# Patient Record
Sex: Female | Born: 1975 | Race: White | Hispanic: No | Marital: Married | Smoking: Former smoker
Health system: Southern US, Community
[De-identification: ages and names within clinical notes are randomized; demographics above are authoritative.]

## PROBLEM LIST (undated history)

## (undated) DIAGNOSIS — M1990 Unspecified osteoarthritis, unspecified site: Secondary | ICD-10-CM

## (undated) DIAGNOSIS — K5090 Crohn's disease, unspecified, without complications: Secondary | ICD-10-CM

## (undated) DIAGNOSIS — M069 Rheumatoid arthritis, unspecified: Secondary | ICD-10-CM

## (undated) DIAGNOSIS — F419 Anxiety disorder, unspecified: Secondary | ICD-10-CM

## (undated) DIAGNOSIS — I10 Essential (primary) hypertension: Secondary | ICD-10-CM

## (undated) DIAGNOSIS — L4050 Arthropathic psoriasis, unspecified: Secondary | ICD-10-CM

## (undated) HISTORY — DX: Anxiety disorder, unspecified: F41.9

## (undated) HISTORY — DX: Crohn's disease, unspecified, without complications: K50.90

## (undated) HISTORY — DX: Unspecified osteoarthritis, unspecified site: M19.90

## (undated) HISTORY — DX: Rheumatoid arthritis, unspecified: M06.9

## (undated) HISTORY — DX: Essential (primary) hypertension: I10

## (undated) HISTORY — PX: COLONOSCOPY: SHX174

## (undated) HISTORY — PX: LASER ABLATION: SHX1947

## (undated) HISTORY — DX: Arthropathic psoriasis, unspecified: L40.50

---

## 1997-12-20 ENCOUNTER — Encounter: Payer: Self-pay | Admitting: Emergency Medicine

## 1997-12-20 ENCOUNTER — Emergency Department (HOSPITAL_COMMUNITY): Admission: EM | Admit: 1997-12-20 | Discharge: 1997-12-20 | Payer: Self-pay | Admitting: Emergency Medicine

## 1998-11-12 ENCOUNTER — Other Ambulatory Visit: Admission: RE | Admit: 1998-11-12 | Discharge: 1998-11-12 | Payer: Self-pay | Admitting: Obstetrics and Gynecology

## 1999-12-03 ENCOUNTER — Other Ambulatory Visit: Admission: RE | Admit: 1999-12-03 | Discharge: 1999-12-03 | Payer: Self-pay | Admitting: Obstetrics and Gynecology

## 2001-02-09 ENCOUNTER — Other Ambulatory Visit: Admission: RE | Admit: 2001-02-09 | Discharge: 2001-02-09 | Payer: Self-pay | Admitting: Obstetrics and Gynecology

## 2002-02-08 ENCOUNTER — Other Ambulatory Visit: Admission: RE | Admit: 2002-02-08 | Discharge: 2002-02-08 | Payer: Self-pay | Admitting: Obstetrics and Gynecology

## 2003-03-28 ENCOUNTER — Other Ambulatory Visit: Admission: RE | Admit: 2003-03-28 | Discharge: 2003-03-28 | Payer: Self-pay | Admitting: Obstetrics and Gynecology

## 2004-05-09 ENCOUNTER — Encounter (INDEPENDENT_AMBULATORY_CARE_PROVIDER_SITE_OTHER): Payer: Self-pay | Admitting: *Deleted

## 2004-05-09 ENCOUNTER — Ambulatory Visit (HOSPITAL_COMMUNITY): Admission: RE | Admit: 2004-05-09 | Discharge: 2004-05-09 | Payer: Self-pay | Admitting: Dermatology

## 2004-05-14 ENCOUNTER — Encounter: Admission: RE | Admit: 2004-05-14 | Discharge: 2004-05-14 | Payer: Self-pay | Admitting: Neurosurgery

## 2004-09-27 ENCOUNTER — Other Ambulatory Visit: Admission: RE | Admit: 2004-09-27 | Discharge: 2004-09-27 | Payer: Self-pay | Admitting: Obstetrics and Gynecology

## 2008-02-01 ENCOUNTER — Inpatient Hospital Stay (HOSPITAL_COMMUNITY): Admission: AD | Admit: 2008-02-01 | Discharge: 2008-02-04 | Payer: Self-pay | Admitting: Obstetrics and Gynecology

## 2008-02-02 ENCOUNTER — Encounter (INDEPENDENT_AMBULATORY_CARE_PROVIDER_SITE_OTHER): Payer: Self-pay | Admitting: Obstetrics and Gynecology

## 2008-02-06 ENCOUNTER — Encounter: Admission: RE | Admit: 2008-02-06 | Discharge: 2008-02-29 | Payer: Self-pay | Admitting: Obstetrics and Gynecology

## 2009-12-05 ENCOUNTER — Ambulatory Visit (HOSPITAL_COMMUNITY): Admission: RE | Admit: 2009-12-05 | Discharge: 2009-12-05 | Payer: Self-pay | Admitting: Obstetrics and Gynecology

## 2010-01-18 ENCOUNTER — Inpatient Hospital Stay (HOSPITAL_COMMUNITY)
Admission: AD | Admit: 2010-01-18 | Discharge: 2010-01-18 | Payer: Self-pay | Source: Home / Self Care | Admitting: Obstetrics and Gynecology

## 2010-02-06 ENCOUNTER — Ambulatory Visit: Payer: Self-pay | Admitting: Obstetrics & Gynecology

## 2010-02-13 ENCOUNTER — Inpatient Hospital Stay (HOSPITAL_COMMUNITY)
Admission: AD | Admit: 2010-02-13 | Discharge: 2010-02-23 | Payer: Self-pay | Source: Home / Self Care | Attending: Obstetrics and Gynecology | Admitting: Obstetrics and Gynecology

## 2010-02-13 ENCOUNTER — Ambulatory Visit: Payer: Self-pay | Admitting: Obstetrics and Gynecology

## 2010-02-14 ENCOUNTER — Ambulatory Visit (HOSPITAL_COMMUNITY)
Admission: RE | Admit: 2010-02-14 | Discharge: 2010-02-14 | Payer: Self-pay | Source: Home / Self Care | Attending: Obstetrics and Gynecology | Admitting: Obstetrics and Gynecology

## 2010-02-20 ENCOUNTER — Ambulatory Visit: Payer: Self-pay | Admitting: Obstetrics and Gynecology

## 2010-02-20 ENCOUNTER — Encounter (HOSPITAL_COMMUNITY): Payer: Self-pay | Admitting: Obstetrics and Gynecology

## 2010-03-24 ENCOUNTER — Encounter: Payer: Self-pay | Admitting: Dermatology

## 2010-05-13 LAB — COMPREHENSIVE METABOLIC PANEL
ALT: 13 U/L (ref 0–35)
ALT: 13 U/L (ref 0–35)
ALT: 14 U/L (ref 0–35)
ALT: 16 U/L (ref 0–35)
AST: 17 U/L (ref 0–37)
AST: 20 U/L (ref 0–37)
AST: 21 U/L (ref 0–37)
AST: 27 U/L (ref 0–37)
Albumin: 2.2 g/dL — ABNORMAL LOW (ref 3.5–5.2)
Albumin: 2.2 g/dL — ABNORMAL LOW (ref 3.5–5.2)
Albumin: 2.3 g/dL — ABNORMAL LOW (ref 3.5–5.2)
Albumin: 2.4 g/dL — ABNORMAL LOW (ref 3.5–5.2)
Alkaline Phosphatase: 131 U/L — ABNORMAL HIGH (ref 39–117)
Alkaline Phosphatase: 134 U/L — ABNORMAL HIGH (ref 39–117)
Alkaline Phosphatase: 138 U/L — ABNORMAL HIGH (ref 39–117)
BUN: 3 mg/dL — ABNORMAL LOW (ref 6–23)
BUN: 6 mg/dL (ref 6–23)
CO2: 20 mEq/L (ref 19–32)
CO2: 25 mEq/L (ref 19–32)
Calcium: 7.1 mg/dL — ABNORMAL LOW (ref 8.4–10.5)
Calcium: 7.4 mg/dL — ABNORMAL LOW (ref 8.4–10.5)
Calcium: 8.4 mg/dL (ref 8.4–10.5)
Chloride: 101 mEq/L (ref 96–112)
Chloride: 106 mEq/L (ref 96–112)
Chloride: 107 mEq/L (ref 96–112)
Creatinine, Ser: 0.67 mg/dL (ref 0.4–1.2)
Creatinine, Ser: 0.73 mg/dL (ref 0.4–1.2)
Creatinine, Ser: 0.76 mg/dL (ref 0.4–1.2)
GFR calc Af Amer: >60 mL/min (ref >60)
GFR calc Af Amer: >60 mL/min (ref >60)
GFR calc Af Amer: >60 mL/min (ref >60)
GFR calc Af Amer: >60 mL/min (ref >60)
GFR calc non Af Amer: >60 mL/min (ref >60)
GFR calc non Af Amer: >60 mL/min (ref >60)
Glucose, Bld: 68 mg/dL — ABNORMAL LOW (ref 70–99)
Glucose, Bld: 70 mg/dL (ref 70–99)
Glucose, Bld: 70 mg/dL (ref 70–99)
Glucose, Bld: 72 mg/dL (ref 70–99)
Potassium: 4.1 mEq/L (ref 3.5–5.1)
Potassium: 4.4 mEq/L (ref 3.5–5.1)
Sodium: 137 mEq/L (ref 135–145)
Sodium: 137 mEq/L (ref 135–145)
Sodium: 137 mEq/L (ref 135–145)
Total Bilirubin: 0.3 mg/dL (ref 0.3–1.2)
Total Bilirubin: 0.6 mg/dL (ref 0.3–1.2)
Total Bilirubin: 0.6 mg/dL (ref 0.3–1.2)
Total Bilirubin: 0.8 mg/dL (ref 0.3–1.2)
Total Protein: 4.7 g/dL — ABNORMAL LOW (ref 6.0–8.3)
Total Protein: 4.9 g/dL — ABNORMAL LOW (ref 6.0–8.3)
Total Protein: 5.3 g/dL — ABNORMAL LOW (ref 6.0–8.3)
Total Protein: 5.5 g/dL — ABNORMAL LOW (ref 6.0–8.3)

## 2010-05-13 LAB — CBC
HCT: 25.8 % — ABNORMAL LOW (ref 36.0–46.0)
HCT: 25.8 % — ABNORMAL LOW (ref 36.0–46.0)
HCT: 25.9 % — ABNORMAL LOW (ref 36.0–46.0)
HCT: 26.1 % — ABNORMAL LOW (ref 36.0–46.0)
Hemoglobin: 8 g/dL — ABNORMAL LOW (ref 12.0–15.0)
Hemoglobin: 8.3 g/dL — ABNORMAL LOW (ref 12.0–15.0)
MCH: 24.9 pg — ABNORMAL LOW (ref 26.0–34.0)
MCH: 25.1 pg — ABNORMAL LOW (ref 26.0–34.0)
MCH: 25.5 pg — ABNORMAL LOW (ref 26.0–34.0)
MCH: 25.6 pg — ABNORMAL LOW (ref 26.0–34.0)
MCH: 25.9 pg — ABNORMAL LOW (ref 26.0–34.0)
MCHC: 30.9 g/dL (ref 30.0–36.0)
MCHC: 31 g/dL (ref 30.0–36.0)
MCHC: 31 g/dL (ref 30.0–36.0)
MCHC: 31.2 g/dL (ref 30.0–36.0)
MCHC: 31.4 g/dL (ref 30.0–36.0)
MCHC: 32 g/dL (ref 30.0–36.0)
MCV: 80.4 fL (ref 78.0–100.0)
MCV: 80.5 fL (ref 78.0–100.0)
MCV: 80.6 fL (ref 78.0–100.0)
MCV: 80.9 fL (ref 78.0–100.0)
MCV: 81.1 fL (ref 78.0–100.0)
MCV: 81.4 fL (ref 78.0–100.0)
Platelets: 138 10*3/uL — ABNORMAL LOW (ref 150–400)
Platelets: 143 10*3/uL — ABNORMAL LOW (ref 150–400)
Platelets: 144 10*3/uL — ABNORMAL LOW (ref 150–400)
Platelets: 148 10*3/uL — ABNORMAL LOW (ref 150–400)
Platelets: 154 10*3/uL (ref 150–400)
Platelets: 184 10*3/uL (ref 150–400)
RBC: 3.2 MIL/uL — ABNORMAL LOW (ref 3.87–5.11)
RBC: 3.21 MIL/uL — ABNORMAL LOW (ref 3.87–5.11)
RBC: 3.4 MIL/uL — ABNORMAL LOW (ref 3.87–5.11)
RBC: 3.54 MIL/uL — ABNORMAL LOW (ref 3.87–5.11)
RDW: 15 % (ref 11.5–15.5)
RDW: 15 % (ref 11.5–15.5)
RDW: 15 % (ref 11.5–15.5)
RDW: 15.4 % (ref 11.5–15.5)
WBC: 11.9 10*3/uL — ABNORMAL HIGH (ref 4.0–10.5)
WBC: 6.7 10*3/uL (ref 4.0–10.5)
WBC: 7.4 10*3/uL (ref 4.0–10.5)
WBC: 8.8 10*3/uL (ref 4.0–10.5)

## 2010-05-13 LAB — URIC ACID
Uric Acid, Serum: 5.3 mg/dL (ref 2.4–7.0)
Uric Acid, Serum: 6.1 mg/dL (ref 2.4–7.0)

## 2010-05-13 LAB — LACTATE DEHYDROGENASE
LDH: 153 U/L (ref 94–250)
LDH: 154 U/L (ref 94–250)
LDH: 189 U/L (ref 94–250)

## 2010-05-13 LAB — MAGNESIUM: Magnesium: 5.1 mg/dL — ABNORMAL HIGH (ref 1.5–2.5)

## 2010-05-13 LAB — TYPE AND SCREEN: ABO/RH(D): O POS

## 2010-05-14 LAB — COMPREHENSIVE METABOLIC PANEL
AST: 21 U/L (ref 0–37)
Albumin: 2.6 g/dL — ABNORMAL LOW (ref 3.5–5.2)
Alkaline Phosphatase: 118 U/L — ABNORMAL HIGH (ref 39–117)
Alkaline Phosphatase: 148 U/L — ABNORMAL HIGH (ref 39–117)
BUN: 3 mg/dL — ABNORMAL LOW (ref 6–23)
BUN: 6 mg/dL (ref 6–23)
Calcium: 8.6 mg/dL (ref 8.4–10.5)
Chloride: 106 mEq/L (ref 96–112)
GFR calc Af Amer: >60 mL/min (ref >60)
Glucose, Bld: 86 mg/dL (ref 70–99)
Potassium: 4.4 mEq/L (ref 3.5–5.1)
Total Bilirubin: 0.6 mg/dL (ref 0.3–1.2)
Total Protein: 6.3 g/dL (ref 6.0–8.3)

## 2010-05-14 LAB — CREATININE CLEARANCE, URINE, 24 HOUR
Creatinine Clearance: 133 mL/min — ABNORMAL HIGH (ref 75–115)
Creatinine, 24H Ur: 1359 mg/d (ref 700–1800)
Creatinine: 0.71 mg/dL (ref 0.4–1.2)
Urine Total Volume-CRCL: 2225 mL

## 2010-05-14 LAB — CBC
HCT: 29.9 % — ABNORMAL LOW (ref 36.0–46.0)
MCH: 28.9 pg (ref 26.0–34.0)
MCHC: 33.9 g/dL (ref 30.0–36.0)
MCV: 81.7 fL (ref 78.0–100.0)
MCV: 85.2 fL (ref 78.0–100.0)
Platelets: 168 10*3/uL (ref 150–400)
RBC: 3.66 MIL/uL — ABNORMAL LOW (ref 3.87–5.11)
RDW: 13.9 % (ref 11.5–15.5)
WBC: 9.1 10*3/uL (ref 4.0–10.5)

## 2010-05-14 LAB — URIC ACID: Uric Acid, Serum: 3.5 mg/dL (ref 2.4–7.0)

## 2010-05-14 LAB — PROTEIN, URINE, 24 HOUR: Collection Interval-UPROT: 24 hours

## 2010-05-14 LAB — STREP B DNA PROBE

## 2010-05-14 LAB — LACTATE DEHYDROGENASE: LDH: 153 U/L (ref 94–250)

## 2010-07-16 NOTE — H&P (Signed)
NAMEQUENNA, DOEPKE               ACCOUNT NO.:  [id number]   MEDICAL RECORD NO.:  [id number]          PATIENT TYPE:  INP   LOCATION:  9164                          FACILITY:  [facility]   PHYSICIAN:  Darlyn Chamber, M.D.   DATE OF BIRTH:  [birth year]   DATE OF ADMISSION:  02/01/2008  DATE OF DISCHARGE:                              HISTORY & PHYSICAL   The patient is a 35 year old gravida 1, para 0, female last menstrual  period of May 16, 2007, given her an estimated date of confinement of  December 21, 2007.  This gives her an estimated gestational age of 4 and  2/7th.   In relation to the present admission, her prenatal course has been  complicated by probable chronic hypertension.  She had been watched  closely and been monitored with nonstress testing and serial  ultrasounds.  Today in the office, her blood pressure was 180/100.  She  had 1+ protein.  Her NST was reactive.  She had 2+ edema and increasing  reflexes.  Because of this, she is now admitted for Cytotec ripening of  the cervix and induction due to worsening pregnancy-induced  hypertension.   The patient does have a history of genital herpes.  She is on Valtrex  suppression, last outbreak was in 2008.  She has no active lesions or  prodromal symptoms.   In terms of allergies, the patient has no known drug allergies.   Medications include prenatal vitamins.   For past medical history, family history, and social history, please see  prenatal records.   REVIEW OF SYSTEMS:  Noncontributory.   PHYSICAL EXAM:  VITAL SIGNS:  As noted in the office, blood pressure  180/100.  Other vital signs were stable.  LUNGS:  Clear.  CARDIOVASCULAR:  Regular rhythm and rate with 2/6 systolic ejection  murmur.  No clicks or gallops.  ABDOMEN:  Gravid uterus consistent with dates.  PELVIC:  The cervix fingertip 25% effaced, -3, no herpetic outbreaks  noted.  This is per Dr. Corinna Capra.  EXTREMITIES:  Edema 2+.  NEUROLOGIC:  Deep tendon  reflexes 3+.   IMPRESSION:  1. Interim pregnancy at 67 and 2/7th with worsening pregnancy-induced      hypertension.  2. History of genital herpes, no active outbreaks or prodromal      symptoms.   PLAN:  In view of increasing blood pressure which indicate severe PIH  with blood pressure is in 180/100, the patient is now admitted for  Cytotec ripening of the cervix and induction of labor.  The potential  risk of the prematurity explained.  The nature of induction discussed.  The risk of Pitocin explained.  In view of hyperstimulation that could  lead to fetal compromise requiring cesarean section.  Again, in terms of  prematurity, I  think this is relatively low.  There is potential for immature lungs  requiring NICU admission, but we believe that the benefits of delivery  now outweigh the potential risk of prematurity both for the patient and  the mother.  The patient does understand this and will be admitted.  Darlyn Chamber, M.D.  Electronically Signed     JSM/MEDQ  D:  02/02/2008  T:  02/02/2008  Job:  [id number]

## 2010-12-06 LAB — URIC ACID: Uric Acid, Serum: 5.1 mg/dL (ref 2.4–7.0)

## 2010-12-06 LAB — COMPREHENSIVE METABOLIC PANEL
ALT: 18 U/L (ref 0–35)
AST: 24 U/L (ref 0–37)
AST: 25 U/L (ref 0–37)
AST: 28 U/L (ref 0–37)
Albumin: 2.6 g/dL — ABNORMAL LOW (ref 3.5–5.2)
Alkaline Phosphatase: 169 U/L — ABNORMAL HIGH (ref 39–117)
BUN: 6 mg/dL (ref 6–23)
BUN: 6 mg/dL (ref 6–23)
CO2: 21 mEq/L (ref 19–32)
Calcium: 8.5 mg/dL (ref 8.4–10.5)
Chloride: 107 mEq/L (ref 96–112)
Creatinine, Ser: 0.77 mg/dL (ref 0.4–1.2)
Creatinine, Ser: 0.82 mg/dL (ref 0.4–1.2)
GFR calc Af Amer: >60 mL/min (ref >60)
GFR calc Af Amer: >60 mL/min (ref >60)
GFR calc Af Amer: >60 mL/min (ref >60)
GFR calc non Af Amer: >60 mL/min (ref >60)
Glucose, Bld: 117 mg/dL — ABNORMAL HIGH (ref 70–99)
Glucose, Bld: 85 mg/dL (ref 70–99)
Potassium: 3.7 mEq/L (ref 3.5–5.1)
Sodium: 136 mEq/L (ref 135–145)
Total Bilirubin: 0.5 mg/dL (ref 0.3–1.2)
Total Protein: 6 g/dL (ref 6.0–8.3)
Total Protein: 6.2 g/dL (ref 6.0–8.3)

## 2010-12-06 LAB — CBC
HCT: 34.7 % — ABNORMAL LOW (ref 36.0–46.0)
HCT: 36 % (ref 36.0–46.0)
Hemoglobin: 12 g/dL (ref 12.0–15.0)
Hemoglobin: 12 g/dL (ref 12.0–15.0)
MCHC: 34.1 g/dL (ref 30.0–36.0)
MCV: 87.2 fL (ref 78.0–100.0)
MCV: 88.8 fL (ref 78.0–100.0)
Platelets: 154 10*3/uL (ref 150–400)
RBC: 3.51 MIL/uL — ABNORMAL LOW (ref 3.87–5.11)
RBC: 3.98 MIL/uL (ref 3.87–5.11)
RBC: 4.01 MIL/uL (ref 3.87–5.11)
RDW: 13.6 % (ref 11.5–15.5)
RDW: 13.9 % (ref 11.5–15.5)
WBC: 19.5 10*3/uL — ABNORMAL HIGH (ref 4.0–10.5)

## 2010-12-06 LAB — LACTATE DEHYDROGENASE: LDH: 193 U/L (ref 94–250)

## 2012-09-07 IMAGING — US US FETAL BPP W/O NONSTRESS
1 series · 14 of 28 positions shown · non-contrast
Comparison: none

[Series 1: us fetal bpp w/o ns modify · non-contrast · 62 acquisitions, 14 frames shown]
[im 3/62]
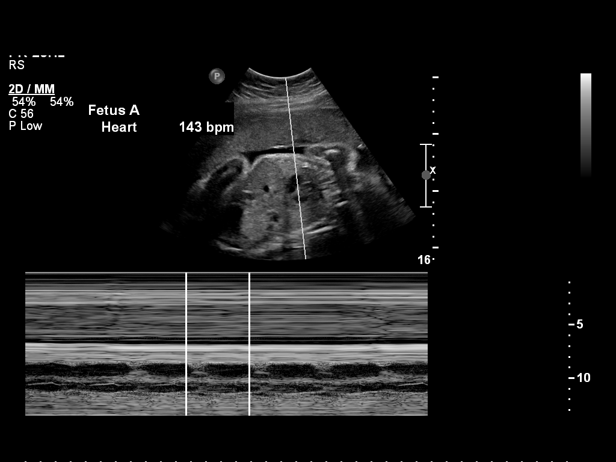
[im 7/62]
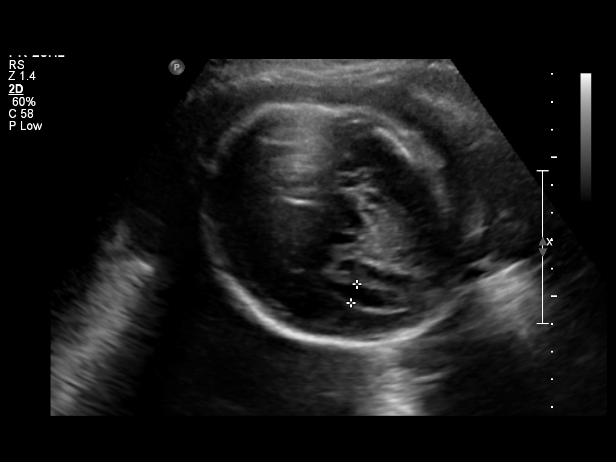
[im 12/62]
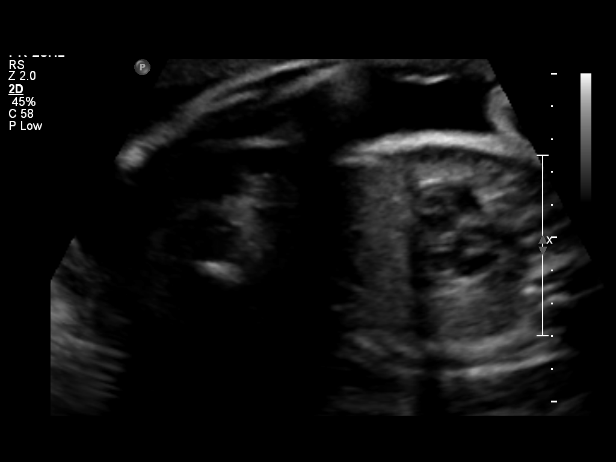
[im 16/62]
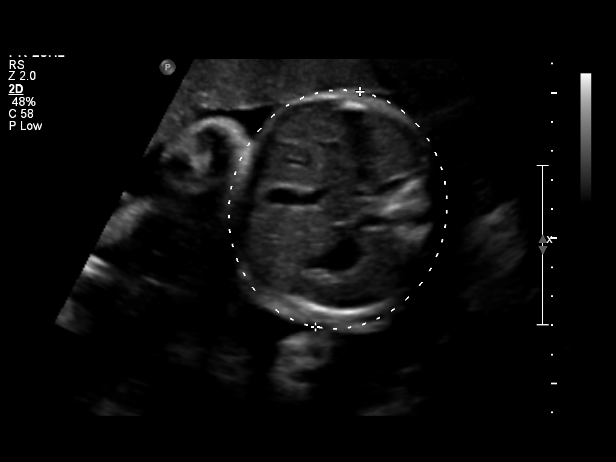
[im 21/62]
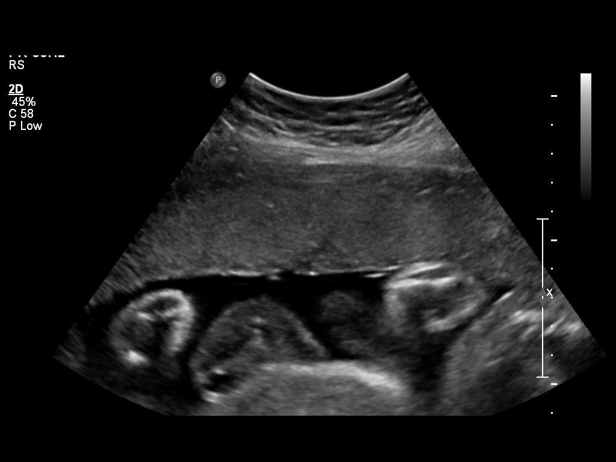
[im 25/62]
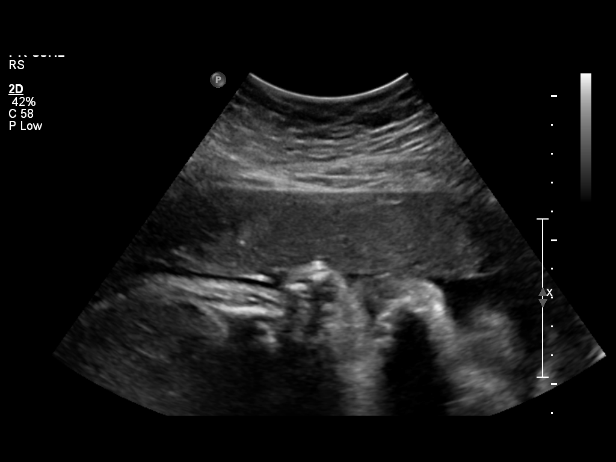
[im 30/62]
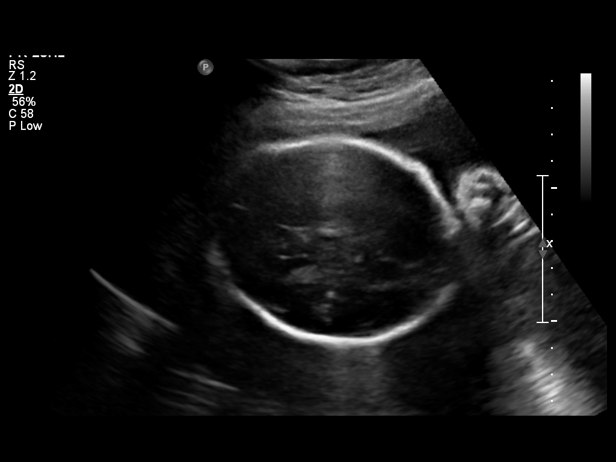
[im 34/62]
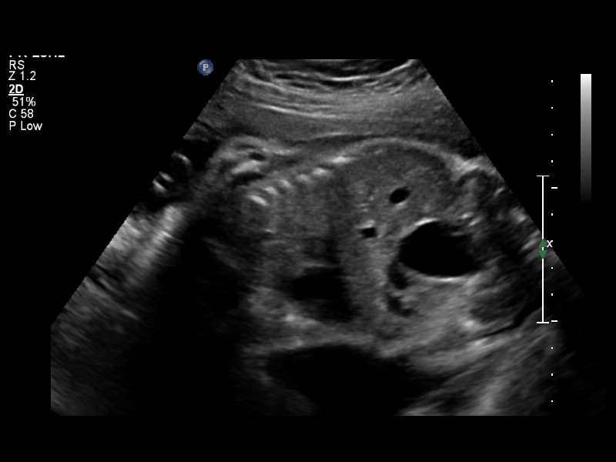
[im 39/62]
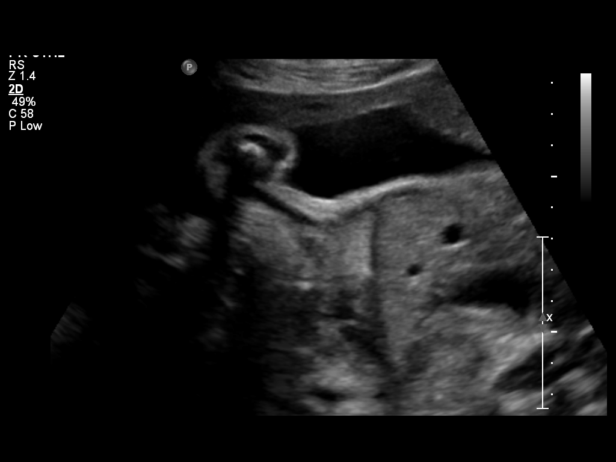
[im 43/62]
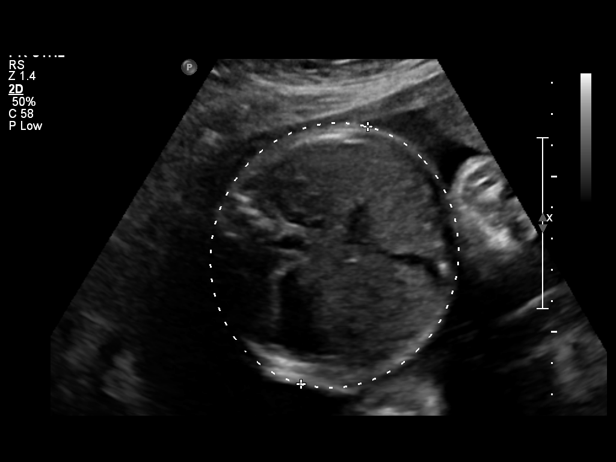
[im 48/62]
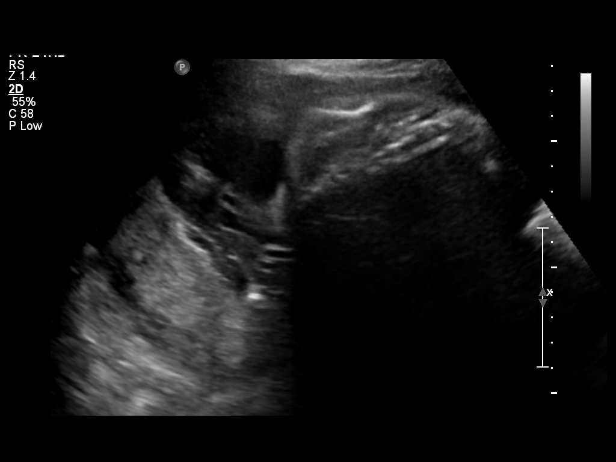
[im 52/62]
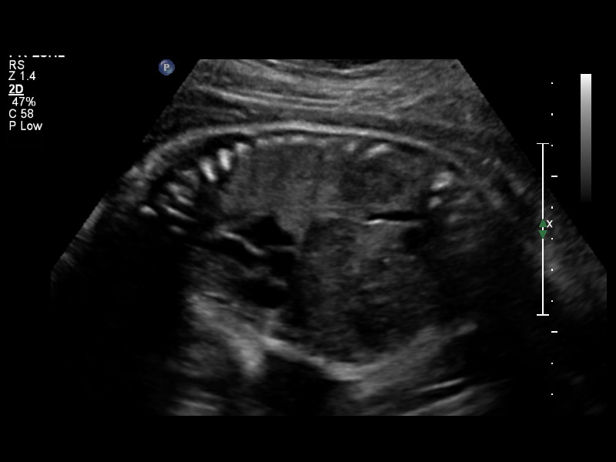
[im 57/62]
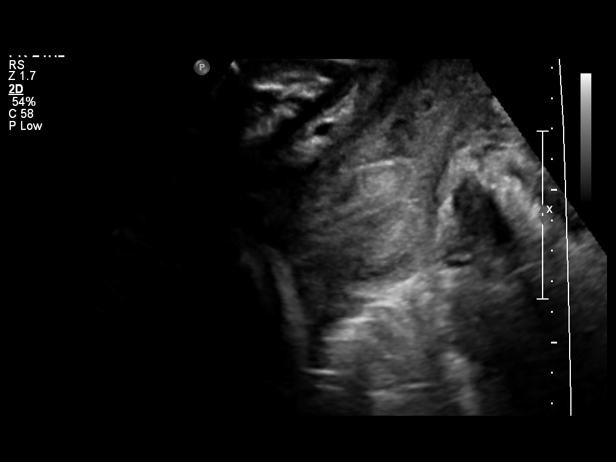
[im 62/62]
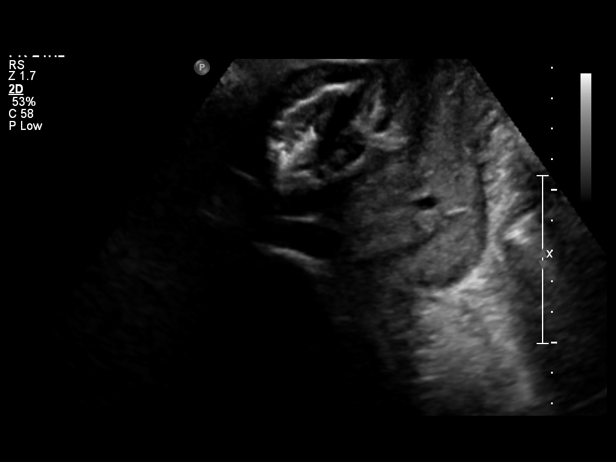

[14 of 28 positions shown; findings below may reference images not displayed]

OBSTETRICS REPORT
                      (Signed Final 01/21/2010 [DATE])

Procedures

 US OB FOLLOW UP                                       76816.1
 US OB FOLLOW UP ADD'L GEST                            76816.2
 US FETAL BPP W/O NS MODIFY                            76819.3
 US FETAL BPP ADD'L GEST MOD                           76819.4
Indications

 Advanced maternal age (AMA), Multigravida
 Herpes simplex virus (CEOLA)
 Twin gestation, Di-Di
 Normal First Trimester Screen x2
 Hypertension - Gestational
 Assess fetal well being
 Assess amniotic fluid volume
Fetal Evaluation (Fetus A)

 Fetal Heart Rate:  143                          bpm
 Cardiac Activity:  Observed
 Fetal Lie:         Maternal left side
 Presentation:      Cephalic
 Placenta:          Anterior, above cervical os
 P. Cord            Previously Visualized
 Insertion:

 Membrane Desc:     Dividing
                    Membrane seen

 Amniotic Fluid
 AFI FV:      Subjectively within normal limits
                                             Larg Pckt:     4.9  cm
Biophysical Evaluation (Fetus A)

 Amniotic F.V:   Pocket => 2 cm two         F. Tone:        Observed
                 planes
 F. Movement:    Observed                   Score:          [DATE]
 F. Breathing:   Observed
Biometry (Fetus A)

 BPD:     79.2  mm     G. Age:  31w 5d                CI:        75.61   70 - 86
                                                      FL/HC:      18.5   19.6 -

 HC:     288.8  mm     G. Age:  31w 5d       85  %    HC/AC:      1.17   0.99 -

 AC:     247.7  mm     G. Age:  29w 0d       36  %    FL/BPD:     67.4   71 - 87
 FL:      53.4  mm     G. Age:  28w 3d       14  %    FL/AC:      21.6   20 - 24

 Est. FW:    8885  gm           3 lb     50  %     FW Discordancy         5  %
Gestational Age (Fetus A)

 LMP:           30w 6d        Date:  06/16/09                 EDD:   03/23/10
 Clinical EDD:  29w 2d                                        EDD:   04/03/10
 U/S Today:     30w 1d                                        EDD:   03/28/10
 Best:          29w 2d     Det. By:  Clinical EDD             EDD:   04/03/10
Anatomy (Fetus A)

 Cranium:           Appears normal      Aortic Arch:       Not well
                                                           visualized
 Fetal Cavum:       Previously seen     Ductal Arch:       Not well
                                                           visualized
 Ventricles:        Appears normal      Diaphragm:         Appears normal
 Choroid Plexus:    Previously seen     Stomach:           Appears
                                                           normal, left
                                                           sided
 Cerebellum:        Previously seen     Abdomen:           Appears normal
 Posterior Fossa:   Previously seen     Abdominal Wall:    Previously seen
 Nuchal Fold:       Not applicable      Cord Vessels:      Previously seen
                    (>20 wks GA)
 Face:              Not well            Kidneys:           Appear normal
                    visualized
 Heart:             Not well            Bladder:           Appears normal
                    visualized
                    today. Seen
                    previously.
 RVOT:              Previously seen     Spine:             Previously seen
 LVOT:              Previously seen     Limbs:             Previously seen

 Other:     Male gender previously seen. Heels and 5th digit
            previously visualized.

Fetal Evaluation (Fetus B)

 Fetal Heart Rate:  158                          bpm
 Cardiac Activity:  Observed
 Fetal Lie:         Maternal right side
 Presentation:      Breech
 Placenta:          Posterior, above cervical
                    os
 P. Cord            Previously Visualized
 Insertion:

 Membrane Desc:     Dividing
                    Membrane seen

 Amniotic Fluid
 AFI FV:      Subjectively within normal limits
                                             Larg Pckt:     4.5  cm
Biophysical Evaluation (Fetus B)
 Amniotic F.V:   Pocket => 2 cm two         F. Tone:        Observed
                 planes
 F. Movement:    Observed                   Score:          [DATE]
 F. Breathing:   Observed
Biometry (Fetus B)

 BPD:     74.4  mm     G. Age:  29w 6d                CI:        73.75   70 - 86
                                                      FL/HC:      19.8   19.6 -

 HC:     275.2  mm     G. Age:  30w 0d       39  %    HC/AC:      1.06   0.99 -

 AC:     258.6  mm     G. Age:  30w 0d       66  %    FL/BPD:     73.3   71 - 87
 FL:      54.5  mm     G. Age:  28w 6d       23  %    FL/AC:      21.1   20 - 24

 Est. FW:    7017  gm      3 lb 2 oz     58  %     FW Discordancy      0 \ 5 %
Gestational Age (Fetus B)

 LMP:           30w 6d        Date:  06/16/09                 EDD:   03/23/10
 Clinical EDD:  29w 2d                                        EDD:   04/03/10
 U/S Today:     29w 5d                                        EDD:   03/31/10
 Best:          29w 2d     Det. By:  Clinical EDD             EDD:   04/03/10
Anatomy (Fetus B)

 Cranium:           Appears normal      Aortic Arch:       Not well
                                                           visualized
 Fetal Cavum:       Previously seen     Ductal Arch:       Not well
                                                           visualized
 Ventricles:        Appears normal      Diaphragm:         Appears normal
 Choroid Plexus:    Previously seen     Stomach:           Appears
                                                           normal, left
                                                           sided
 Cerebellum:        Previously seen     Abdomen:           Appears normal
 Posterior Fossa:   Previously seen     Abdominal Wall:    Previously seen
 Nuchal Fold:       Not applicable      Cord Vessels:      Previously seen
                    (>20 wks GA)
 Face:              Previously seen     Kidneys:           Appear normal
 Heart:             Echogenic           Bladder:           Appears normal
                    focus in LV
                    seen prev
 RVOT:              Previously seen     Spine:             Previously seen
 LVOT:              Previously seen     Limbs:             Previously seen

 Other:     Female gender previously seen. Heels, 5th digit, and
            Nasal bone previously seen.
Cervix Uterus Adnexa

 Cervical Length:    4.1      cm

 Cervix:       Measured translabially.

 Left Ovary:    Previously seen.
 Right Ovary:   Previously seen
Impression

 Di di living twin gestation both with BPP [DATE]. Presenting twin
 A in cephalic presentation, B in breech.
 Concordant interval growth.
 No late-developing anomaly in visualized structures above.

 questions or concerns.

## 2013-01-18 ENCOUNTER — Other Ambulatory Visit: Payer: Self-pay | Admitting: Obstetrics and Gynecology

## 2014-01-09 ENCOUNTER — Other Ambulatory Visit (HOSPITAL_COMMUNITY): Payer: Self-pay | Admitting: Family Medicine

## 2014-01-09 ENCOUNTER — Ambulatory Visit
Admission: RE | Admit: 2014-01-09 | Discharge: 2014-01-09 | Disposition: A | Discharge status: Home / Self Care | Payer: BC Managed Care – PPO | Source: Ambulatory Visit | Attending: Family Medicine | Admitting: Family Medicine

## 2014-01-09 DIAGNOSIS — R05 Cough: Secondary | ICD-10-CM

## 2014-01-09 DIAGNOSIS — R509 Fever, unspecified: Secondary | ICD-10-CM

## 2014-01-09 DIAGNOSIS — R059 Cough, unspecified: Secondary | ICD-10-CM

## 2014-08-07 ENCOUNTER — Other Ambulatory Visit: Payer: Self-pay | Admitting: Obstetrics and Gynecology

## 2014-08-09 LAB — CYTOLOGY - PAP

## 2015-07-17 DIAGNOSIS — L400 Psoriasis vulgaris: Secondary | ICD-10-CM | POA: Diagnosis not present

## 2015-08-13 DIAGNOSIS — L400 Psoriasis vulgaris: Secondary | ICD-10-CM | POA: Diagnosis not present

## 2015-08-13 DIAGNOSIS — L4050 Arthropathic psoriasis, unspecified: Secondary | ICD-10-CM | POA: Diagnosis not present

## 2015-10-12 DIAGNOSIS — L4059 Other psoriatic arthropathy: Secondary | ICD-10-CM | POA: Diagnosis not present

## 2015-12-12 DIAGNOSIS — L409 Psoriasis, unspecified: Secondary | ICD-10-CM | POA: Diagnosis not present

## 2015-12-12 DIAGNOSIS — R5383 Other fatigue: Secondary | ICD-10-CM | POA: Diagnosis not present

## 2015-12-12 DIAGNOSIS — L4059 Other psoriatic arthropathy: Secondary | ICD-10-CM | POA: Diagnosis not present

## 2016-01-15 DIAGNOSIS — Z1231 Encounter for screening mammogram for malignant neoplasm of breast: Secondary | ICD-10-CM | POA: Diagnosis not present

## 2016-01-15 DIAGNOSIS — Z6837 Body mass index (BMI) 37.0-37.9, adult: Secondary | ICD-10-CM | POA: Diagnosis not present

## 2016-01-15 DIAGNOSIS — Z01419 Encounter for gynecological examination (general) (routine) without abnormal findings: Secondary | ICD-10-CM | POA: Diagnosis not present

## 2016-01-15 LAB — HM MAMMOGRAPHY

## 2016-02-11 DIAGNOSIS — L409 Psoriasis, unspecified: Secondary | ICD-10-CM | POA: Diagnosis not present

## 2016-02-11 DIAGNOSIS — L4059 Other psoriatic arthropathy: Secondary | ICD-10-CM | POA: Diagnosis not present

## 2016-03-10 DIAGNOSIS — N924 Excessive bleeding in the premenopausal period: Secondary | ICD-10-CM | POA: Diagnosis not present

## 2016-04-14 DIAGNOSIS — L409 Psoriasis, unspecified: Secondary | ICD-10-CM | POA: Diagnosis not present

## 2016-04-14 DIAGNOSIS — L4059 Other psoriatic arthropathy: Secondary | ICD-10-CM | POA: Diagnosis not present

## 2016-05-08 DIAGNOSIS — N920 Excessive and frequent menstruation with regular cycle: Secondary | ICD-10-CM | POA: Diagnosis not present

## 2016-05-08 DIAGNOSIS — N858 Other specified noninflammatory disorders of uterus: Secondary | ICD-10-CM | POA: Diagnosis not present

## 2016-07-14 DIAGNOSIS — L4059 Other psoriatic arthropathy: Secondary | ICD-10-CM | POA: Diagnosis not present

## 2016-07-14 DIAGNOSIS — L409 Psoriasis, unspecified: Secondary | ICD-10-CM | POA: Diagnosis not present

## 2016-09-29 ENCOUNTER — Ambulatory Visit (INDEPENDENT_AMBULATORY_CARE_PROVIDER_SITE_OTHER): Payer: BLUE CROSS/BLUE SHIELD | Admitting: Family Medicine

## 2016-09-29 ENCOUNTER — Encounter: Payer: Self-pay | Admitting: Physician Assistant

## 2016-09-29 ENCOUNTER — Encounter: Payer: Self-pay | Admitting: Family Medicine

## 2016-09-29 VITALS — BP 146/94 | HR 83 | Temp 98.2°F | Ht 66.75 in | Wt 213.2 lb

## 2016-09-29 DIAGNOSIS — Z114 Encounter for screening for human immunodeficiency virus [HIV]: Secondary | ICD-10-CM

## 2016-09-29 DIAGNOSIS — Z23 Encounter for immunization: Secondary | ICD-10-CM | POA: Diagnosis not present

## 2016-09-29 DIAGNOSIS — F419 Anxiety disorder, unspecified: Secondary | ICD-10-CM

## 2016-09-29 DIAGNOSIS — R109 Unspecified abdominal pain: Secondary | ICD-10-CM | POA: Diagnosis not present

## 2016-09-29 DIAGNOSIS — Z7689 Persons encountering health services in other specified circumstances: Secondary | ICD-10-CM

## 2016-09-29 DIAGNOSIS — R197 Diarrhea, unspecified: Secondary | ICD-10-CM

## 2016-09-29 DIAGNOSIS — K921 Melena: Secondary | ICD-10-CM | POA: Diagnosis not present

## 2016-09-29 DIAGNOSIS — I10 Essential (primary) hypertension: Secondary | ICD-10-CM | POA: Diagnosis not present

## 2016-09-29 LAB — CBC
HEMATOCRIT: 40.3 % (ref 36.0–46.0)
HEMOGLOBIN: 13.5 g/dL (ref 12.0–15.0)
MCHC: 33.4 g/dL (ref 30.0–36.0)
MCV: 93.2 fl (ref 78.0–100.0)
Platelets: 307 10*3/uL (ref 150.0–400.0)
RBC: 4.33 Mil/uL (ref 3.87–5.11)
RDW: 13.7 % (ref 11.5–15.5)
WBC: 7 10*3/uL (ref 4.0–10.5)

## 2016-09-29 LAB — COMPREHENSIVE METABOLIC PANEL
ALK PHOS: 77 U/L (ref 39–117)
ALT: 16 U/L (ref 0–35)
AST: 17 U/L (ref 0–37)
Albumin: 3.9 g/dL (ref 3.5–5.2)
BILIRUBIN TOTAL: 0.4 mg/dL (ref 0.2–1.2)
BUN: 11 mg/dL (ref 6–23)
CO2: 27 mEq/L (ref 19–32)
Calcium: 9.4 mg/dL (ref 8.4–10.5)
Chloride: 103 mEq/L (ref 96–112)
Creatinine, Ser: 0.86 mg/dL (ref 0.40–1.20)
GFR: 76.93 mL/min (ref >60.00)
Glucose, Bld: 91 mg/dL (ref 70–99)
POTASSIUM: 4.1 meq/L (ref 3.5–5.1)
Sodium: 138 mEq/L (ref 135–145)
TOTAL PROTEIN: 6.9 g/dL (ref 6.0–8.3)

## 2016-09-29 LAB — LIPID PANEL
Cholesterol: 198 mg/dL (ref 0–200)
HDL: 47.2 mg/dL (ref >39.00)
NonHDL: 151.04
TRIGLYCERIDES: 201 mg/dL — AB (ref 0.0–149.0)
Total CHOL/HDL Ratio: 4
VLDL: 40.2 mg/dL — ABNORMAL HIGH (ref 0.0–40.0)

## 2016-09-29 LAB — TSH: TSH: 2.2 u[IU]/mL (ref 0.35–4.50)

## 2016-09-29 LAB — LDL CHOLESTEROL, DIRECT: Direct LDL: 121 mg/dL

## 2016-09-29 LAB — SEDIMENTATION RATE: SED RATE: 28 mm/h — AB (ref 0–20)

## 2016-09-29 MED ORDER — CLONAZEPAM 0.5 MG PO TABS: 0.5000 mg | ORAL_TABLET | Freq: Two times a day (BID) | ORAL | 1 refills | Status: DC | PRN

## 2016-09-29 NOTE — Progress Notes (Signed)
Subjective:    Patient ID: Meagan Grimes, female    DOB: 1975/06/30, 41 y.o.   MRN: [id number]  HPI This is a 41 yo female who presents today to establish care. 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 Enjoys coloring, spending time with her family.   Psoriatic arthritis- Sees Dr. Amil Amen for psoriatic arthritis. Is currently on Humira, has been on for 1 year, has had improvement of symptoms. Was previously on methotrexate.   Dysmenorrhea- Had uterine ablation 3/18 for dysmenorrhea. Has not had any bleeding. Continues to feel hormonal fluctuations.   HTN- Was diagnosed with HTN 1/18, was started on lisinopril. Stopped taking after several months with decreased BP with diet and exercise.   Hematochezia/abdominal pain- Has been having bloody bowel movements off and on since January. Having up to 10 bowel movements daily, not a large volume. Blood in stool was intermittent now more constant. Some stools watery, some formed. Is getting up in the middle of the night having bowel movements. Some improvement in stool with probiotic. No hemorrhoids or pain with defecation. Pain in right upper quadrant, occasional sharp pain (several episodes over last couple of months, resolves spontaneously), intermittent. Some lower abdominal cramping, more frequent than sharp pains. Seems worse with increased stress/anxiety. Has lost 30-40 pounds since first of the year with diet and exercise. Doing Juice Plus and using Running app.   Anxiety- Increased anxiety over last couple of months. Has had intermittent anxiety for years. Was bad three years ago. Having intermittent anxiety attacks currently, about 1x/ month. Was on Sherrill for her arthritis but it increased her anxiety. Was on a benzo at low dose in past with good relief. Sleep was better before stomach was bothering her. She feels anxiety was fairly manageable with diet and exercise prior to her rectal  bleeding and abdominal pain.    Past Medical History:  Diagnosis Date  . Anxiety   . Arthritis   . Hypertension    Past Surgical History:  Procedure Laterality Date  . CESAREAN SECTION     Family History  Problem Relation Age of Onset  . Hypertension Mother   . Hypertension Father   . Mental illness Father    Social History  Substance Use Topics  . Smoking status: Former Research scientist (life sciences)  . Smokeless tobacco: Never Used  . Alcohol use 3.0 oz/week    5 Glasses of wine per week      Review of Systems Per HPI    Objective:   Physical Exam  Constitutional: She is oriented to person, place, and time. She appears well-developed and well-nourished.  HENT:  Head: Normocephalic and atraumatic.  Mouth/Throat: Oropharynx is clear and moist.  Eyes: Conjunctivae are normal.  Neck: Normal range of motion. Neck supple. No thyromegaly present.  Cardiovascular: Normal rate, regular rhythm and normal heart sounds.   Pulmonary/Chest: Effort normal and breath sounds normal.  Abdominal: Soft. Bowel sounds are normal. She exhibits no distension. There is tenderness (mild tenderness with deep palpation. ) in the left upper quadrant and left lower quadrant. There is no rigidity, no rebound, no guarding, no tenderness at McBurney's point and negative Beeks's sign.  Genitourinary: Rectal exam shows guaiac positive stool.  Genitourinary Comments: Small rectal skin tag. Brown stool.   Musculoskeletal: She exhibits no edema.  Lymphadenopathy:    She has no cervical adenopathy.  Neurological: She is alert and oriented to person, place, and time.  Skin:  Skin is warm and dry.  Psychiatric: She has a normal mood and affect. Her behavior is normal. Judgment and thought content normal.  Vitals reviewed.     BP (!) 146/94 (BP Location: Right Arm, Patient Position: Sitting, Cuff Size: Normal)   Pulse 83   Temp 98.2 F (36.8 C) (Oral)   Ht 5' 6.75" (1.695 m)   Wt 213 lb 3.2 oz (96.7 kg)   SpO2 98%    BMI 33.64 kg/m  Wt Readings from Last 3 Encounters:  09/29/16 213 lb 3.2 oz (96.7 kg)       Assessment & Plan:  1. Encounter to establish care  2. Blood in stool - ongoing for many months, urgent referral to GI - CBC - Comprehensive metabolic panel - Ambulatory referral to Gastroenterology  3. Diarrhea, unspecified type - intermittent, not consistent with C-diff, will defer stool studies to GI - CBC - Comprehensive metabolic panel - Sedimentation rate - Ambulatory referral to Gastroenterology  4. Essential hypertension - CBC - Comprehensive metabolic panel - TSH - Lipid panel  5. Screening for HIV (human immunodeficiency virus) - HIV antibody  6. Anxiety - discussed daily medication vs intermittent treatment. Patient does not want to go on daily medication and feels that increased anxiety is related to recent abdominal symptoms. She prefers to have intermittent treatment while getting GI symptoms worked up.  - discussed risks and benefits of benzodiazepine use including dependence.  - clonazePAM (KLONOPIN) 0.5 MG tablet; Take 1 tablet (0.5 mg total) by mouth 2 (two) times daily as needed for anxiety.  Dispense: 20 tablet; Refill: 1  7. Abdominal pain, unspecified abdominal location - Ambulatory referral to Gastroenterology  8. Need for Tdap vaccination - Tdap vaccine greater than or equal to 7yo IM  - Over 45 minutes were spent face-to-face with the patient during this encounter and >50% of that time was spent on counseling and coordination of care   - follow up in 2 months  Clarene Reamer, FNP-BC  [facility], [place]  09/29/2016 8:46 PM

## 2016-09-29 NOTE — Patient Instructions (Addendum)
Try some yoga a couple of times a week. Exercise as tolerated.   I will notify you of lab results via [facility].   Please let me know if you have not heard about a GI appointment in 2 days

## 2016-09-30 LAB — HIV ANTIBODY (ROUTINE TESTING W REFLEX): HIV: NONREACTIVE

## 2016-10-07 ENCOUNTER — Telehealth: Payer: Self-pay | Admitting: Family Medicine

## 2016-10-07 NOTE — Telephone Encounter (Signed)
ROI faxed to Dr. Corinna [facility]

## 2016-10-09 ENCOUNTER — Encounter: Payer: Self-pay | Admitting: Physician Assistant

## 2016-10-09 ENCOUNTER — Ambulatory Visit (INDEPENDENT_AMBULATORY_CARE_PROVIDER_SITE_OTHER): Payer: BLUE CROSS/BLUE SHIELD | Admitting: Physician Assistant

## 2016-10-09 VITALS — BP 122/90 | HR 80 | Ht 66.75 in | Wt 210.5 lb

## 2016-10-09 DIAGNOSIS — R197 Diarrhea, unspecified: Secondary | ICD-10-CM | POA: Diagnosis not present

## 2016-10-09 DIAGNOSIS — K921 Melena: Secondary | ICD-10-CM | POA: Diagnosis not present

## 2016-10-09 DIAGNOSIS — R1031 Right lower quadrant pain: Secondary | ICD-10-CM

## 2016-10-09 DIAGNOSIS — R1032 Left lower quadrant pain: Secondary | ICD-10-CM

## 2016-10-09 MED ORDER — NA SULFATE-K SULFATE-MG SULF 17.5-3.13-1.6 GM/177ML PO SOLN: 1.0000 | ORAL | 0 refills | Status: DC

## 2016-10-09 NOTE — Patient Instructions (Signed)
Your physician has requested that you go to the basement for lab work before leaving today.  You have been scheduled for a colonoscopy. Please follow written instructions given to you at your visit today.  Please pick up your prep supplies at the pharmacy within the next 1-3 days. If you use inhalers (even only as needed), please bring them with you on the day of your procedure. Your physician has requested that you go to [email or web address] and enter the access code given to you at your visit today. This web site gives a general overview about your procedure. However, you should still follow specific instructions given to you by our office regarding your preparation for the procedure.

## 2016-10-09 NOTE — Progress Notes (Signed)
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      Per record review patient was seen by PCP on 09/29/16 for these complaints and had labs at that time with an elevated ESR at 28, CMP and CBC were normal.   Today, the patient tells me that in January she drastically changed her diet to include more fruits and vegetables in an effort to lose weight, she has lost about 30 pounds since that time but around the end of January and beginning of February she noted that she had a change in her bowel movements. She describes that she would have 2-3 days of multiple loose stools that often had bright red blood in them then she may not have a problem for 2-3 weeks, it was very "random". She then went on a work trip to [place] about a month ago and ever since then she has had multiple loose bowel movements per day. When she initially returned from his trip she was having 12-15 loose bowel movements with blood, then she would have 4-5 days of just loose stools, then 5-6 that had blood, then 1-2 that may just be loose. Patient did recently restart her probiotics and feels as though this has slowed the frequency of stools but she does continue with some bright red blood mixed in with loose stools on most occasions. She has not had a solid stool for at least 6 weeks. Associated symptoms include nausea if she eats too much as well as some lower abdominal cramping which is worse before a bowel movement and better afterwards.     Patient also describes vague right upper quadrant sharp abdominal pain which comes and goes periodically, this is very sharp in nature and at least an 8-9/10 when it occurs but is only occurred 8-10 times in the past 8  months and lasts for 20-30 min. This seems unrelated to eating and unrelated to her other symptoms.   Patient's medical history is positive for being on Humira every other Monday for her psoriatic arthritis.   Patient denies a family history of IBD, fatigue, anorexia, fever, chills, melena, vomiting, heartburn, reflux or symptoms that awaken her at night.  Past Medical History:  Diagnosis Date  . Anxiety   . Arthritis   . Arthropathic psoriasis ([place])   . Hypertension     Past Surgical History:  Procedure Laterality Date  . CESAREAN SECTION    . LASER ABLATION      Current Outpatient Prescriptions  Medication Sig Dispense Refill  . clonazePAM (KLONOPIN) 0.5 MG tablet Take 1 tablet (0.5 mg total) by mouth 2 (two) times daily as needed for anxiety. 20 tablet 1  . HUMIRA PEN 40 MG/0.8ML PNKT      No current facility-administered medications for this visit.     Allergies as of 10/09/2016  . (No Known Allergies)    Family History  Problem Relation Age of Onset  . Hypertension Mother   . Hypertension Father   . Mental illness Father   . Lung cancer Maternal Grandmother   . Hypertension Maternal Grandfather   . Heart disease Maternal Grandfather   . Heart disease Paternal Grandmother   . Hypertension Paternal Grandmother   . Lung  cancer Maternal Aunt   . Colon cancer Neg Hx   . Esophageal cancer Neg Hx   . Rectal cancer Neg Hx   . Liver cancer Neg Hx     Social History   Social History  . Marital status: Married    Spouse name: N/A  . Number of children: 3  . Years of education: N/A   Occupational History  . IT trainer    Social History Main Topics  . Smoking status: Former Research scientist (life sciences)  . Smokeless tobacco: Never Used  . Alcohol use 3.0 oz/week    5 Glasses of wine per week  . Drug use: No  . Sexual activity: Yes    Birth control/ protection: None   Other Topics Concern  . Not on file   Social History Narrative  . No narrative on file    Review of  Systems:    Constitutional: No fever or chills Skin: Positive for psoriasis Cardiovascular: No chest pain, chest pressure or palpitations   Respiratory: No SOB or cough Gastrointestinal: See HPI and otherwise negative Genitourinary: No dysuria or change in urinary frequency Neurological: Positive for headaches Musculoskeletal: Positive for arthritis and back pain Hematologic: No bleeding or bruising Psychiatric: Positive for anxiety   Physical Exam:  Vital signs: BP 122/90   Pulse 80   Ht 5' 6.75" (1.695 m)   Wt 210 lb 8 oz (95.5 kg)   BMI 33.22 kg/m   Constitutional:   Pleasant overweight Caucasian female appears to be in NAD, Well developed, Well nourished, alert and cooperative Head:  Normocephalic and atraumatic. Eyes:   PEERL, EOMI. No icterus. Conjunctiva pink. Ears:  Normal auditory acuity. Neck:  Supple Throat: Oral cavity and pharynx without inflammation, swelling or lesion.  Respiratory: Respirations even and unlabored. Lungs clear to auscultation bilaterally.   No wheezes, crackles, or rhonchi.  Cardiovascular: Normal S1, S2. No MRG. Regular rate and rhythm. No peripheral edema, cyanosis or pallor.  Gastrointestinal:  Soft, nondistended, Mild ttp in b/l lower quadrants to deep palpation No rebound or guarding. Normal bowel sounds. No appreciable masses or hepatomegaly. Rectal:  Not performed today  Msk:  Symmetrical without gross deformities. Without edema, no deformity or joint abnormality.  Neurologic:  Alert and  oriented x4;  grossly normal neurologically.  Skin:   Dry and intact without significant lesions or rashes.Psoriatic lesions Psychiatric: Demonstrates good judgement and reason without abnormal affect or behaviors.  MOST RECENT LABS AND IMAGING: CBC    Component Value Date/Time   WBC 7.0 09/29/2016 1034   RBC 4.33 09/29/2016 1034   HGB 13.5 09/29/2016 1034   HCT 40.3 09/29/2016 1034   PLT 307.0 09/29/2016 1034   MCV 93.2 09/29/2016 1034   MCH 25.2  (L) 02/22/2010 0531   MCHC 33.4 09/29/2016 1034   RDW 13.7 09/29/2016 1034    CMP     Component Value Date/Time   NA 138 09/29/2016 1034   K 4.1 09/29/2016 1034   CL 103 09/29/2016 1034   CO2 27 09/29/2016 1034   GLUCOSE 91 09/29/2016 1034   BUN 11 09/29/2016 1034   CREATININE 0.86 09/29/2016 1034   CALCIUM 9.4 09/29/2016 1034   PROT 6.9 09/29/2016 1034   ALBUMIN 3.9 09/29/2016 1034   AST 17 09/29/2016 1034   ALT 16 09/29/2016 1034   ALKPHOS 77 09/29/2016 1034   BILITOT 0.4 09/29/2016 1034   GFRNONAA >60 02/22/2010 0531   GFRAA  02/22/2010 0531    >60  The eGFR has been calculated using the MDRD equation. This calculation has not been validated in all clinical situations. eGFR's persistently <60 mL/min signify possible Chronic Kidney Disease.    Assessment: 1. Hematochezia: Started 8 months ago, initially periodically, now constant, 5-6 loose stools per day with abdominal cramping, medical history positive for psoriatic arthritis; high suspicion for IBD 2. Diarrhea: With above, no prior stool studies 3. Bilateral lower abdominal cramping: With above  Plan: 1. Scheduled patient for colonoscopy with Dr. Silverio Decamp, as she is supervising this morning. Did discuss risks, benefits, limitations and alternatives. 2. Order stool studies to include a GI pathogen panel, O and P and lactoferrin 3. Recommend the patient avoid NSAIDs 4. Patient to return to clinic per recommendations after time of procedure  Ellouise Newer, PA-C [facility] 10/09/2016, 9:00 AM  Cc: Elby Beck, FNP

## 2016-10-13 ENCOUNTER — Encounter: Payer: Self-pay | Admitting: Gastroenterology

## 2016-10-14 NOTE — Progress Notes (Signed)
Reviewed and agree with documentation and assessment and plan. K. Veena Aalyah Mansouri , MD   

## 2016-10-21 ENCOUNTER — Ambulatory Visit (AMBULATORY_SURGERY_CENTER): Payer: BLUE CROSS/BLUE SHIELD | Admitting: Gastroenterology

## 2016-10-21 ENCOUNTER — Other Ambulatory Visit: Payer: Self-pay

## 2016-10-21 ENCOUNTER — Encounter: Payer: Self-pay | Admitting: Gastroenterology

## 2016-10-21 ENCOUNTER — Encounter: Payer: Self-pay | Admitting: Family Medicine

## 2016-10-21 VITALS — BP 151/84 | HR 67 | Temp 99.6°F | Resp 16 | Ht 66.0 in | Wt 210.0 lb

## 2016-10-21 DIAGNOSIS — K922 Gastrointestinal hemorrhage, unspecified: Secondary | ICD-10-CM

## 2016-10-21 DIAGNOSIS — K6389 Other specified diseases of intestine: Secondary | ICD-10-CM | POA: Diagnosis not present

## 2016-10-21 DIAGNOSIS — R197 Diarrhea, unspecified: Secondary | ICD-10-CM

## 2016-10-21 DIAGNOSIS — K921 Melena: Secondary | ICD-10-CM | POA: Diagnosis not present

## 2016-10-21 DIAGNOSIS — K529 Noninfective gastroenteritis and colitis, unspecified: Secondary | ICD-10-CM | POA: Diagnosis not present

## 2016-10-21 DIAGNOSIS — K52839 Microscopic colitis, unspecified: Secondary | ICD-10-CM

## 2016-10-21 MED ORDER — BUDESONIDE ER 9 MG PO TB24: 9.0000 mg | ORAL_TABLET | Freq: Every day | ORAL | 0 refills | Status: DC

## 2016-10-21 MED ORDER — MESALAMINE 1000 MG RE SUPP: 1000.0000 mg | Freq: Every day | RECTAL | 0 refills | Status: DC

## 2016-10-21 MED ORDER — MESALAMINE 1.2 G PO TBEC: 4.8000 g | DELAYED_RELEASE_TABLET | Freq: Every day | ORAL | 3 refills | Status: DC

## 2016-10-21 MED ORDER — SODIUM CHLORIDE 0.9 % IV SOLN: 500.0000 mL | INTRAVENOUS | Status: DC

## 2016-10-21 NOTE — Progress Notes (Signed)
Discussed with Dr. Silverio Decamp urgency of blood work/cdif sample. Patient will need the end of the week or next week. Informed Beth, dr. Woodward Ku nurse. Patient has a cdif stool sample kit at her home. Patient will come in on Friday.

## 2016-10-21 NOTE — Op Note (Signed)
[facility] Patient Name: Meagan Grimes Procedure Date: 10/21/2016 11:22 AM MRN: [id number] Endoscopist: Mauri Pole , MD Age: 41 Referring MD:  Date of Birth: [birth year] Gender: Female Account #: [id number] Procedure:                Colonoscopy Indications:              Evaluation of unexplained GI bleeding Medicines:                Monitored Anesthesia Care Procedure:                Pre-Anesthesia Assessment:                           - Prior to the procedure, a History and Physical                            was performed, and patient medications and                            allergies were reviewed. The patient's tolerance of                            previous anesthesia was also reviewed. The risks                            and benefits of the procedure and the sedation                            options and risks were discussed with the patient.                            All questions were answered, and informed consent                            was obtained. Prior Anticoagulants: The patient has                            taken no previous anticoagulant or antiplatelet                            agents. ASA Grade Assessment: II - A patient with                            mild systemic disease. After reviewing the risks                            and benefits, the patient was deemed in                            satisfactory condition to undergo the procedure.                           After obtaining informed consent, the colonoscope  was passed under direct vision. Throughout the                            procedure, the patient's blood pressure, pulse, and                            oxygen saturations were monitored continuously. The                            Colonoscope was introduced through the anus and                            advanced to the the terminal ileum, with                            identification of the  appendiceal orifice and IC                            valve. The colonoscopy was performed without                            difficulty. The patient tolerated the procedure                            well. The quality of the bowel preparation was                            excellent. The terminal ileum, ileocecal valve,                            appendiceal orifice, and rectum were photographed. Scope In: 11:29:31 AM Scope Out: 11:45:25 AM Scope Withdrawal Time: 0 hours 11 minutes 26 seconds  Total Procedure Duration: 0 hours 15 minutes 54 seconds  Findings:                 The perianal and digital rectal examinations were                            normal.                           Diffuse moderate inflammation characterized by                            adherent blood, altered vascularity, erosions,                            erythema, linear erosions, loss of vascularity,                            mucus and aphthous ulcerations was found in the                            rectum, in the sigmoid colon and in the descending  colon. Biopsies were taken with a cold forceps for                            histology. Cecum, ascending and transverse colon                            appeared normal. Biopsies were taken with a cold                            forceps for histology.                           The terminal ileum appeared normal. Biopsies were                            taken with a cold forceps for histology. Complications:            No immediate complications. Estimated Blood Loss:     Estimated blood loss was minimal. Impression:               - Diffuse moderate inflammation was found in the                            rectum, in the sigmoid colon and in the descending                            colon secondary to left-sided colitis. Biopsied.                           - The examined portion of the ileum was normal.                             Biopsied. Recommendation:           - Patient has a contact number available for                            emergencies. The signs and symptoms of potential                            delayed complications were discussed with the                            patient. Return to normal activities tomorrow.                            Written discharge instructions were provided to the                            patient.                           - Resume previous diet.                           - Continue present medications.                           -  No aspirin, ibuprofen, naproxen, or other                            non-steroidal anti-inflammatory drugs.                           - Await pathology results.                           - Start Uceris (Budesonide) 61m daily X 4 weeks                           - Lialda 4.8gm daily 30 tabs with 3 refills                           - Mesalamine enema at bedtime daily X 14 days                           - Follow up stool C.diff                           - TB quantiferon and Hep B/C                           - Repeat colonoscopy is recommended to check                            healing. The colonoscopy date will be determined                            after pathology results from today's exam become                            available for review.                           - Return to GI clinic in 2-3 weeks, ok to schedule                            with extender. KMauri Pole MD 10/21/2016 11:54:21 AM This report has been signed electronically.

## 2016-10-21 NOTE — Progress Notes (Signed)
Spontaneous respirations throughout. VSS. Resting comfortably. To PACU on room air. Report to  RN. 

## 2016-10-21 NOTE — Progress Notes (Signed)
Called to room to assist during endoscopic procedure.  Patient ID and intended procedure confirmed with present staff. Received instructions for my participation in the procedure from the performing physician.  

## 2016-10-21 NOTE — Patient Instructions (Signed)
Impression/Recommendations:  Resume previous diet. Continue present medications. No aspirin, Ibuprofen, naproxen, or other NSAID drugs. Await pathology results.  Start Uceris (Budesonide) 9 mg. Daily for 4 weeks. Lialda 4.8 gm daily. Mesalamine enema at bedtime daily for 14 days.  Repeat colonoscopy recommended to check healing.  Date to be determined after pathology results reviewed.  Return to GI clinic in 2-3 weeks.  YOU HAD AN ENDOSCOPIC PROCEDURE TODAY AT [facility]:   Refer to the procedure report that was given to you for any specific questions about what was found during the examination.  If the procedure report does not answer your questions, please call your gastroenterologist to clarify.  If you requested that your care partner not be given the details of your procedure findings, then the procedure report has been included in a sealed envelope for you to review at your convenience later.  YOU SHOULD EXPECT: Some feelings of bloating in the abdomen. Passage of more gas than usual.  Walking can help get rid of the air that was put into your GI tract during the procedure and reduce the bloating. If you had a lower endoscopy (such as a colonoscopy or flexible sigmoidoscopy) you may notice spotting of blood in your stool or on the toilet paper. If you underwent a bowel prep for your procedure, you may not have a normal bowel movement for a few days.  Please Note:  You might notice some irritation and congestion in your nose or some drainage.  This is from the oxygen used during your procedure.  There is no need for concern and it should clear up in a day or so.  SYMPTOMS TO REPORT IMMEDIATELY:   Following lower endoscopy (colonoscopy or flexible sigmoidoscopy):  Excessive amounts of blood in the stool  Significant tenderness or worsening of abdominal pains  Swelling of the abdomen that is new, acute  Fever of 100F or higher For urgent or emergent issues, a  gastroenterologist can be reached at any hour by calling [phone].   DIET:  We do recommend a small meal at first, but then you may proceed to your regular diet.  Drink plenty of fluids but you should avoid alcoholic beverages for 24 hours.  ACTIVITY:  You should plan to take it easy for the rest of today and you should NOT DRIVE or use heavy machinery until tomorrow (because of the sedation medicines used during the test).    FOLLOW UP: Our staff will call the number listed on your records the next business day following your procedure to check on you and address any questions or concerns that you may have regarding the information given to you following your procedure. If we do not reach you, we will leave a message.  However, if you are feeling well and you are not experiencing any problems, there is no need to return our call.  We will assume that you have returned to your regular daily activities without incident.  If any biopsies were taken you will be contacted by phone or by letter within the next 1-3 weeks.  Please call us at [phone] if you have not heard about the biopsies in 3 weeks.    SIGNATURES/CONFIDENTIALITY: You and/or your care partner have signed paperwork which will be entered into your electronic medical record.  These signatures attest to the fact that that the information above on your After Visit Summary has been reviewed and is understood.  Full responsibility of the confidentiality of this  discharge information lies with you and/or your care-partner.

## 2016-10-22 ENCOUNTER — Telehealth: Payer: Self-pay | Admitting: *Deleted

## 2016-10-22 NOTE — Telephone Encounter (Signed)
  Follow up Call-  Call back number 10/21/2016  Post procedure Call Back phone  # [phone]  Permission to leave phone message Yes  Some recent data might be hidden     Patient questions:  Do you have a fever, pain , or abdominal swelling? No. Pain Score  0 *  Have you tolerated food without any problems? Yes.    Have you been able to return to your normal activities? Yes.    Do you have any questions about your discharge instructions: Diet   No. Medications  No. Follow up visit  No.  Do you have questions or concerns about your Care? No.  Actions: * If pain score is 4 or above: No action needed, pain <4.

## 2016-10-24 ENCOUNTER — Other Ambulatory Visit: Payer: BLUE CROSS/BLUE SHIELD

## 2016-10-24 DIAGNOSIS — K52839 Microscopic colitis, unspecified: Secondary | ICD-10-CM | POA: Diagnosis not present

## 2016-10-24 DIAGNOSIS — K921 Melena: Secondary | ICD-10-CM

## 2016-10-24 DIAGNOSIS — K922 Gastrointestinal hemorrhage, unspecified: Secondary | ICD-10-CM

## 2016-10-24 DIAGNOSIS — R1031 Right lower quadrant pain: Secondary | ICD-10-CM | POA: Diagnosis not present

## 2016-10-24 DIAGNOSIS — R197 Diarrhea, unspecified: Secondary | ICD-10-CM

## 2016-10-24 DIAGNOSIS — R1032 Left lower quadrant pain: Secondary | ICD-10-CM

## 2016-10-25 LAB — C. DIFFICILE GDH AND TOXIN A/B
C. difficile GDH: NOT DETECTED
C. difficile Toxin A/B: NOT DETECTED

## 2016-10-25 LAB — QUANTIFERON TB GOLD ASSAY (BLOOD)
Interferon Gamma Release Assay: NEGATIVE
Mitogen-Nil: >10 IU/mL
Quantiferon Nil Value: 0.06 IU/mL
Quantiferon Tb Ag Minus Nil Value: 0.14 IU/mL

## 2016-10-27 ENCOUNTER — Telehealth: Payer: Self-pay | Admitting: Gastroenterology

## 2016-10-27 LAB — GASTROINTESTINAL PATHOGEN PANEL PCR
C. DIFFICILE TOX A/B, PCR: NOT DETECTED
Campylobacter, PCR: NOT DETECTED
Cryptosporidium, PCR: NOT DETECTED
E COLI (ETEC) LT/ST, PCR: NOT DETECTED
E COLI (STEC) STX1/STX2, PCR: NOT DETECTED
E COLI 0157, PCR: NOT DETECTED
Giardia lamblia, PCR: NOT DETECTED
Norovirus, PCR: NOT DETECTED
Rotavirus A, PCR: NOT DETECTED
SALMONELLA, PCR: NOT DETECTED
Shigella, PCR: NOT DETECTED

## 2016-10-27 LAB — OVA AND PARASITE EXAMINATION: OP: NONE SEEN

## 2016-10-27 LAB — FECAL LACTOFERRIN, QUANT: Lactoferrin: POSITIVE

## 2016-10-27 NOTE — Telephone Encounter (Signed)
Advised of lab results

## 2016-11-11 ENCOUNTER — Ambulatory Visit: Payer: BLUE CROSS/BLUE SHIELD | Admitting: Physician Assistant

## 2016-11-13 ENCOUNTER — Encounter: Payer: Self-pay | Admitting: Physician Assistant

## 2016-11-13 ENCOUNTER — Ambulatory Visit (INDEPENDENT_AMBULATORY_CARE_PROVIDER_SITE_OTHER): Payer: BLUE CROSS/BLUE SHIELD | Admitting: Physician Assistant

## 2016-11-13 VITALS — BP 126/90 | HR 84 | Ht 66.75 in | Wt 212.0 lb

## 2016-11-13 DIAGNOSIS — K51811 Other ulcerative colitis with rectal bleeding: Secondary | ICD-10-CM

## 2016-11-13 MED ORDER — BUDESONIDE 3 MG PO CPEP: ORAL_CAPSULE | ORAL | 0 refills | Status: DC

## 2016-11-13 MED ORDER — MESALAMINE 1.2 G PO TBEC: DELAYED_RELEASE_TABLET | ORAL | 6 refills | Status: DC

## 2016-11-13 MED ORDER — GLYCOPYRROLATE 2 MG PO TABS
2.0000 mg | ORAL_TABLET | Freq: Two times a day (BID) | ORAL | 6 refills | Status: DC
Start: 2016-11-13 — End: 2017-06-13

## 2016-11-13 NOTE — Progress Notes (Signed)
Subjective:    Patient ID: MEEYAH OVITT, female    DOB: Nov 23, 1975, 41 y.o.   MRN: [id number]  HPI Rhayne is a pleasant 41 year old white female, recently established with Dr. Silverio Decamp who had initially been seen for diarrhea and rectal bleeding. She underwent Colonoscopy on 10/21/2016 with finding of a diffuse moderate inflammation with aphthous ulcers erythema and loss of vascularity in the rectum, sigmoid and descending colon. The right colon and terminal ileum appeared normal. Biopsies returned showing mildly active colitis most consistent with ulcerative colitis. Patient was started on Lialda 4.8 g daily and Uceris 9 mg daily. She comes back in today for follow-up. She states that she is definitely better. At her worst she was having about 10 bowel movements per day and was seeing bright red blood with each bowel movement. This had been associated with lower abdominal cramping. At this point she is having 2-3 bowel movements usually each morning still some urgency and mild cramping but says usually after the first couple of bowel movements in the morning she is fine for the rest of the day. He is still seeing a small amount of blood but definitely less than previous. Her appetite is good, she's not had any fever or chills. No nausea and is tolerating meds well. She mentions that she has been under a tremendous amount of stress and has a constant level of anxiety. She has twins who are 42 years old, and 7-year-old and a very stressful full-time job.  Review of Systems Pertinent positive and negative review of systems were noted in the above HPI section.  All other review of systems was otherwise negative.  Outpatient Encounter Prescriptions as of 11/13/2016  Medication Sig  . Budesonide ER (UCERIS) 9 MG TB24 Take 9 mg by mouth daily.  . clonazePAM (KLONOPIN) 0.5 MG tablet Take 1 tablet (0.5 mg total) by mouth 2 (two) times daily as needed for anxiety.  Marland Kitchen HUMIRA PEN 40 MG/0.8ML PNKT   .  mesalamine (LIALDA) 1.2 g EC tablet Take 4 tablets by mouth every morning.  . [DISCONTINUED] mesalamine (LIALDA) 1.2 g EC tablet Take 4 tablets (4.8 g total) by mouth daily with breakfast.  . budesonide (ENTOCORT EC) 3 MG 24 hr capsule Take as directed  . glycopyrrolate (ROBINUL) 2 MG tablet Take 1 tablet (2 mg total) by mouth 2 (two) times daily.  . [DISCONTINUED] mesalamine (CANASA) 1000 MG suppository Place 1 suppository (1,000 mg total) rectally at bedtime.   Facility-Administered Encounter Medications as of 11/13/2016  Medication  . 0.9 %  sodium chloride infusion   No Known Allergies There are no active problems to display for this patient.  Social History   Social History  . Marital status: Married    Spouse name: N/A  . Number of children: 3  . Years of education: N/A   Occupational History  . IT trainer    Social History Main Topics  . Smoking status: Former Research scientist (life sciences)  . Smokeless tobacco: Never Used  . Alcohol use 3.0 oz/week    5 Glasses of wine per week  . Drug use: No  . Sexual activity: Yes    Birth control/ protection: None   Other Topics Concern  . Not on file   Social History Narrative  . No narrative on file    Ms. Spray's family history includes Heart disease in her maternal grandfather and paternal grandmother; Hypertension in her father, maternal grandfather, mother, and paternal grandmother; Lung cancer in her maternal aunt and  maternal grandmother; Mental illness in her father.      Objective:    Vitals:   11/13/16 0825  BP: 126/90  Pulse: 84    Physical Exam  well-developed white female in no acute distress, pleasant, blood pressure 126/90 pulse 84, height 5 foot 6, weight 212, BMI 33.4. HEENT; nontraumatic,normo cephalic EOMI PERRLA sclera anicteric, Cardiovascular; regular rate and rhythm with S1-S2 no murmur or gallop, Pulmonary ;clear bilaterally Abdomen;, soft basically nontender there is no palpable mass or hepatosplenomegaly bowel  sounds present, Rectal; exam not done, Extremities; no clubbing cyanosis or edema skin warm and dry, Neuropsych ;mood and affect appropriate       Assessment & Plan:   #24 41 year old white female with new diagnosis of left-sided ulcerative colitis. Patient is improving on current regimen of Lialda 4.8 g daily and U Sarris 9 mg daily  Plan; Continue Lialda 4.8 g daily, we discussed that this would be a maintenance medication moving forward Continue Uceris 9 mg daily for 5 more weeks to complete a two-month course then decrease to 6 mg daily 1 month then decrease to 3 mg daily 1 month then taper off. Start Robinul Forte 2 mg by mouth every morning, she may use twice daily as needed She will follow-up with Dr. Silverio Decamp or myself in 3-4 months and knows to call in the interim should she have any increase in symptoms, and if symptoms flare as she tapers off U Sarris.   Genia Harold PA-C 11/13/2016   Cc: Elby Beck, FNP

## 2016-11-13 NOTE — Patient Instructions (Addendum)
We made you a follow up appointment to see Dr. Silverio Decamp for 01-29-2017 at 8:45 am.  We have sent the following medications to your pharmacy for you to pick up at your convenience: [place].  1. Lialda 2. Robinul Forte 3. Budesonide 3 mg tablets  Budesonide 3 mg Take 3 tablets daily for 5 weeks Take 2 tablets daily for 30 days Take 1 tablet daily for 30 days.  Then stop

## 2016-11-17 ENCOUNTER — Other Ambulatory Visit: Payer: Self-pay | Admitting: Gastroenterology

## 2016-11-18 NOTE — Progress Notes (Signed)
Reviewed and agree with documentation and assessment and plan. K. Veena Zitlali Primm , MD   

## 2016-12-08 DIAGNOSIS — L409 Psoriasis, unspecified: Secondary | ICD-10-CM | POA: Diagnosis not present

## 2016-12-08 DIAGNOSIS — K51811 Other ulcerative colitis with rectal bleeding: Secondary | ICD-10-CM | POA: Diagnosis not present

## 2016-12-08 DIAGNOSIS — L4059 Other psoriatic arthropathy: Secondary | ICD-10-CM | POA: Diagnosis not present

## 2016-12-11 ENCOUNTER — Other Ambulatory Visit: Payer: Self-pay | Admitting: *Deleted

## 2016-12-11 MED ORDER — BUDESONIDE 3 MG PO CPEP: ORAL_CAPSULE | ORAL | 3 refills | Status: DC

## 2016-12-31 ENCOUNTER — Ambulatory Visit: Payer: BLUE CROSS/BLUE SHIELD | Admitting: Family Medicine

## 2016-12-31 ENCOUNTER — Encounter: Payer: Self-pay | Admitting: Family Medicine

## 2016-12-31 ENCOUNTER — Ambulatory Visit (INDEPENDENT_AMBULATORY_CARE_PROVIDER_SITE_OTHER): Payer: BLUE CROSS/BLUE SHIELD | Admitting: Family Medicine

## 2016-12-31 DIAGNOSIS — F419 Anxiety disorder, unspecified: Secondary | ICD-10-CM | POA: Insufficient documentation

## 2016-12-31 DIAGNOSIS — I10 Essential (primary) hypertension: Secondary | ICD-10-CM | POA: Diagnosis not present

## 2016-12-31 MED ORDER — HYDROXYZINE HCL 50 MG PO TABS: 50.0000 mg | ORAL_TABLET | Freq: Three times a day (TID) | ORAL | 3 refills | Status: DC | PRN

## 2016-12-31 NOTE — Progress Notes (Signed)
    Subjective:  Meagan Grimes is a 41 y.o. female who presents today with a chief complaint of hypertension and anxiety.   HPI:  Hypertension, Chronic Problem, Stable BP Readings from Last 3 Encounters:  12/31/16 (!) 130/100  11/13/16 126/90  10/21/16 (!) 151/84   Home BP monitoring: Yes, Ranges: 130s-140s over 80s-90s Current Medications: None Interim History: Patient has been off lisinopril for the past few weeks.  ROS: Denies any chest Grimes, shortness of breath, dyspnea on exertion, leg edema.    Anxiety, Chronic, Worsening Current Medications: Klonopin 0.5 twice daily as needed Side Effects: Excessive sedation the day after taking a klonopin. Current Symptoms/Interim History: Long-standing history.  Has been on Xanax and Valium in the past.  Was seen 3 months ago started on Klonopin.  Feels that this did not work fast enough for symptoms and additionally felt "hung over" the next day.  She would like something that could work potentially faster and not have the next day effects.  She does not have any specific triggers.  Symptoms seem to be worse at night.  She is also upcoming a stressful period at work and in her home life with upcoming holidays and several birthdays.  GAD 7 : Generalized Anxiety Score 12/31/2016  Nervous, Anxious, on Edge 2  Control/stop worrying 1  Worry too much - different things 2  Trouble relaxing 3  Restless 2  Easily annoyed or irritable 1  Afraid - awful might happen 0  Total GAD 7 Score 11  Anxiety Difficulty Not difficult at all    ROS: No SI or HI.  ROS: Per HPI  PMH: Smoking history reviewed.  Former smoker.  Objective:  Physical Exam: BP (!) 130/100 (BP Location: Right Arm, Cuff Size: Large)   Pulse 80   Ht 5' 6.75" (1.695 m)   Wt 217 lb (98.4 kg)   SpO2 98%   BMI 34.24 kg/m   Gen: NAD, resting comfortably CV: RRR with no murmurs appreciated Pulm: NWOB, CTAB with no crackles, wheezes, or rhonchi Psych: Normal affect and  thought content  Assessment/Plan:  Hypertension Slightly above goal today.  Advised patient to continue checking blood pressures at home goal 140/90 or lower.  Follow-up in 4-6 weeks.  Would consider starting beta-blocker if persistently elevated given her concurrent anxiety disorder.  Anxiety GAD mildly elevated today.  We will stop her Klonopin.  Discussed treatment options including SSRI/SSRI, as needed medications, and psychotherapy.  Patient wishes to proceed with as needed medications.  She will also look into starting psychotherapy in our office.  Will start hydroxyzine 50 mg 3 times daily as needed.  Follow-up in 4-6 weeks.  Consider starting SSRI if symptoms are not adequately controlled.   Algis Greenhouse. Meagan Pain, MD 12/31/2016 9:45 AM

## 2016-12-31 NOTE — Patient Instructions (Addendum)
We will start hydroxyzine today.  You can take this up to 3 times a day.  If you need a stronger dose you can double up.  50 mg is too strong you can try breaking the pill in half.  Please let us know if you are interested in seeing our behavioral specialist.  Keep an eye on your blood pressure.  If it is persistently elevated above 140/90 please let us know.  Come back to see me in 4-6 weeks, or sooner as needed.  Take care, Dr. Jerline Pain

## 2016-12-31 NOTE — Assessment & Plan Note (Signed)
Slightly above goal today.  Advised patient to continue checking blood pressures at home goal 140/90 or lower.  Follow-up in 4-6 weeks.  Would consider starting beta-blocker if persistently elevated given her concurrent anxiety disorder.

## 2016-12-31 NOTE — Assessment & Plan Note (Addendum)
Worsening.  GAD mildly elevated today.  We will stop her Klonopin.  Discussed treatment options including SSRI/SSRI, as needed medications, and psychotherapy.  Patient wishes to proceed with as needed medications.  She will also look into starting psychotherapy in our office.  Will start hydroxyzine 50 mg 3 times daily as needed.  Follow-up in 4-6 weeks.  Consider starting SSRI if symptoms are not adequately controlled.

## 2017-01-29 ENCOUNTER — Other Ambulatory Visit (INDEPENDENT_AMBULATORY_CARE_PROVIDER_SITE_OTHER): Payer: BLUE CROSS/BLUE SHIELD

## 2017-01-29 ENCOUNTER — Encounter: Payer: Self-pay | Admitting: Gastroenterology

## 2017-01-29 ENCOUNTER — Ambulatory Visit: Payer: BLUE CROSS/BLUE SHIELD | Admitting: Gastroenterology

## 2017-01-29 VITALS — BP 118/80 | HR 81 | Ht 67.5 in | Wt 218.0 lb

## 2017-01-29 DIAGNOSIS — K5100 Ulcerative (chronic) pancolitis without complications: Secondary | ICD-10-CM

## 2017-01-29 DIAGNOSIS — K5180 Other ulcerative colitis without complications: Secondary | ICD-10-CM | POA: Diagnosis not present

## 2017-01-29 LAB — COMPREHENSIVE METABOLIC PANEL
ALT: 17 U/L (ref 0–35)
AST: 14 U/L (ref 0–37)
Albumin: 4.1 g/dL (ref 3.5–5.2)
Alkaline Phosphatase: 69 U/L (ref 39–117)
BUN: 15 mg/dL (ref 6–23)
CALCIUM: 9.3 mg/dL (ref 8.4–10.5)
CO2: 29 meq/L (ref 19–32)
Chloride: 103 mEq/L (ref 96–112)
Creatinine, Ser: 0.95 mg/dL (ref 0.40–1.20)
GFR: 68.47 mL/min (ref >60.00)
GLUCOSE: 96 mg/dL (ref 70–99)
POTASSIUM: 4.9 meq/L (ref 3.5–5.1)
Sodium: 137 mEq/L (ref 135–145)
Total Bilirubin: 0.6 mg/dL (ref 0.2–1.2)
Total Protein: 7.4 g/dL (ref 6.0–8.3)

## 2017-01-29 LAB — CBC WITH DIFFERENTIAL/PLATELET
BASOS ABS: 0 10*3/uL (ref 0.0–0.1)
Basophils Relative: 0.4 % (ref 0.0–3.0)
EOS PCT: 2.1 % (ref 0.0–5.0)
Eosinophils Absolute: 0.2 10*3/uL (ref 0.0–0.7)
HCT: 40.3 % (ref 36.0–46.0)
Hemoglobin: 13.2 g/dL (ref 12.0–15.0)
LYMPHS PCT: 27 % (ref 12.0–46.0)
Lymphs Abs: 2.2 10*3/uL (ref 0.7–4.0)
MCHC: 32.9 g/dL (ref 30.0–36.0)
MCV: 92.4 fl (ref 78.0–100.0)
MONOS PCT: 7.6 % (ref 3.0–12.0)
Monocytes Absolute: 0.6 10*3/uL (ref 0.1–1.0)
NEUTROS ABS: 5.1 10*3/uL (ref 1.4–7.7)
Neutrophils Relative %: 62.9 % (ref 43.0–77.0)
PLATELETS: 269 10*3/uL (ref 150.0–400.0)
RBC: 4.36 Mil/uL (ref 3.87–5.11)
RDW: 15.3 % (ref 11.5–15.5)
WBC: 8.2 10*3/uL (ref 4.0–10.5)

## 2017-01-29 LAB — SEDIMENTATION RATE: SED RATE: 6 mm/h (ref 0–20)

## 2017-01-29 MED ORDER — MESALAMINE 1.2 G PO TBEC: DELAYED_RELEASE_TABLET | ORAL | 11 refills | Status: DC

## 2017-01-29 NOTE — Progress Notes (Signed)
Meagan Grimes    [id number]    October 10, 1975  Primary Care Physician:Parker, Algis Greenhouse, MD  Referring Physician: Elby Beck, [address] [place], [place] [address]  Chief complaint: Ulcerative colitis  HPI:  41 year old female recently diagnosed with ulcerative colitis August 2018 with evidence of moderate inflammation and ulceration of left colon.  She was started on Lialda 4 g daily and Uceris 9 mg daily, was tapered down to budesonide 6 mg daily this month after completing 49-monthcourse of Uceris 9 mg daily.  She is doing well overall with 1-2 formed bowel movements daily, no blood per rectum.  Denies any nausea, vomiting, abdominal pain, dysphagia or weight loss.   Outpatient Encounter Medications as of 01/29/2017  Medication Sig  . budesonide (ENTOCORT EC) 3 MG 24 hr capsule Take 6 mg by mouth daily.  .Marland Kitchenglycopyrrolate (ROBINUL) 2 MG tablet Take 1 tablet (2 mg total) by mouth 2 (two) times daily.  .Marland KitchenHUMIRA PEN 40 MG/0.8ML PNKT   . hydrOXYzine (ATARAX/VISTARIL) 50 MG tablet Take 1 tablet (50 mg total) by mouth 3 (three) times daily as needed for anxiety.  . mesalamine (LIALDA) 1.2 g EC tablet Take 4 tablets by mouth every morning.  . [DISCONTINUED] budesonide (ENTOCORT EC) 3 MG 24 hr capsule Take as directed  . [DISCONTINUED] mesalamine (LIALDA) 1.2 g EC tablet Take 4 tablets by mouth every morning.   Facility-Administered Encounter Medications as of 01/29/2017  Medication  . 0.9 %  sodium chloride infusion    Allergies as of 01/29/2017  . (No Known Allergies)    Past Medical History:  Diagnosis Date  . Anxiety   . Arthritis   . 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 Hypertension     Past Surgical History:  Procedure Laterality Date  . CESAREAN SECTION    . LASER ABLATION      Family History  Problem Relation Age of Onset  . Hypertension Mother   . Hypertension Father   . Mental illness Father   . Lung cancer Maternal Grandmother   .  Hypertension Maternal Grandfather   . Heart disease Maternal Grandfather   . Heart disease Paternal Grandmother   . Hypertension Paternal Grandmother   . Lung cancer Maternal Aunt   . Colon cancer Neg Hx   . Esophageal cancer Neg Hx   . Rectal cancer Neg Hx   . Liver cancer Neg Hx   . Stomach cancer Neg Hx     Social History   Socioeconomic History  . Marital status: Married    Spouse name: Not on file  . Number of children: 3  . Years of education: Not on file  . Highest education level: Not on file  Social Needs  . Financial resource strain: Not on file  . Food insecurity - worry: Not on file  . Food insecurity - inability: Not on file  . Transportation needs - medical: Not on file  . Transportation needs - non-medical: Not on file  Occupational History  . 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 Smoking status: Former SResearch scientist (life sciences) . Smokeless tobacco: Never Used  Substance and Sexual Activity  . Alcohol use: Yes    Alcohol/week: 3.0 oz    Types: 5 Glasses of wine per week  . Drug use: No  . Sexual activity: Yes    Partners: Male    Birth control/protection: None  Other Topics Concern  .  Not on file  Social History Narrative  . Not on file      Review of systems: Review of Systems  Constitutional: Negative for fever and chills.  HENT: Negative.   Eyes: Negative for blurred vision.  Respiratory: Negative for cough, shortness of breath and wheezing.   Cardiovascular: Negative for chest pain and palpitations.  Gastrointestinal: as per HPI Genitourinary: Negative for dysuria, urgency, frequency and hematuria.  Musculoskeletal: Positive for myalgias, back pain and joint pain.  Skin: Negative for itching and rash.  Neurological: Negative for dizziness, tremors, focal weakness, seizures and loss of consciousness.  Endo/Heme/Allergies: Positive for seasonal allergies.  Psychiatric/Behavioral: Negative for depression, suicidal ideas and hallucinations. Positive for  anxiety All other systems reviewed and are negative.   Physical Exam: Vitals:   01/29/17 0837  BP: 118/80  Pulse: 81   Body mass index is 33.64 kg/m. Gen:      No acute distress HEENT:  EOMI, sclera anicteric Neck:     No masses; no thyromegaly Lungs:    Clear to auscultation bilaterally; normal respiratory effort CV:         Regular rate and rhythm; no murmurs Abd:      + bowel sounds; soft, non-tender; no palpable masses, no distension Ext:    No edema; adequate peripheral perfusion Skin:      Warm and dry; no rash Neuro: alert and oriented x 3 Psych: normal mood and affect  Data Reviewed:  Reviewed labs, radiology imaging, old records and pertinent past GI work up   Assessment and Plan/Recommendations:  41 year old female with recent diagnosis of left-sided ulcerative colitis August 2018 here for follow-up visit Overall doing well on Lialda 4.8 g daily Continue tapering budesonide, 6 mg daily for 1 month followed by 3 mg daily for 1 month and stop Follow-up CBC, CMP and CRP Return in 2-3 months    K. Denzil Magnuson , MD [phone] Mon-Fri 8a-5p [phone] after 5p, weekends, holidays  CC: Elby Beck, FNP

## 2017-01-29 NOTE — Patient Instructions (Signed)
If you are age 42 or older, your body mass index should be between 23-30. Your Body mass index is 33.64 kg/m. If this is out of the aforementioned range listed, please consider follow up with your Primary Care Provider.  If you are age 73 or younger, your body mass index should be between 19-25. Your Body mass index is 33.64 kg/m. If this is out of the aformentioned range listed, please consider follow up with your Primary Care Provider.   Your physician has requested that you go to the basement for lab work before leaving today.  Continue Lialda 4 tabs a day.  Please taper the Budesonide as directed.  Budesonide 31m daily for one month.  Budesonide 353mdaily for one month.  Thank you for choosing [facility]

## 2017-02-25 ENCOUNTER — Encounter: Payer: Self-pay | Admitting: Physician Assistant

## 2017-02-25 ENCOUNTER — Ambulatory Visit: Payer: BLUE CROSS/BLUE SHIELD | Admitting: Physician Assistant

## 2017-02-25 ENCOUNTER — Telehealth: Payer: Self-pay | Admitting: Gastroenterology

## 2017-02-25 VITALS — BP 132/90 | HR 84 | Ht 67.5 in | Wt 218.0 lb

## 2017-02-25 DIAGNOSIS — K645 Perianal venous thrombosis: Secondary | ICD-10-CM | POA: Diagnosis not present

## 2017-02-25 DIAGNOSIS — K5180 Other ulcerative colitis without complications: Secondary | ICD-10-CM | POA: Diagnosis not present

## 2017-02-25 DIAGNOSIS — K6289 Other specified diseases of anus and rectum: Secondary | ICD-10-CM | POA: Diagnosis not present

## 2017-02-25 MED ORDER — HYDROCORTISONE 0.5 % EX CREA: 1.0000 "application " | TOPICAL_CREAM | Freq: Two times a day (BID) | CUTANEOUS | 0 refills | Status: DC

## 2017-02-25 NOTE — Telephone Encounter (Signed)
The pt has a large hemorrhoid that is protruding and causing pain, BRB with every BM.  Has had 4 watery stools today.  The UC is under control but the hemorrhoid is the issue.  Pain when setting or walking.  Prep H for 2 days, sitz baths in epsom salt.  No relief.  Appt today with Meagan Grimes

## 2017-02-25 NOTE — Patient Instructions (Signed)
You have been scheduled for an appointment with Carlena Hurl, PA at [facility]. Your appointment is on 02-27-17 at 11 am. Please arrive at 10:30 am for registration. Make certain to bring a list of current medications, including any over the counter medications or vitamins. Also bring your co-pay if you have one as well as your insurance cards. [facility] is located at [address], [address]. Should you need to reschedule your appointment, please contact them at [phone].  We have sent the following medications to your pharmacy for you to pick up at your convenience: Hydrocortisone ointment twice a day for the next 1-2 weeks.   Please purchase the following medications over the counter and take as directed: Reticare with lidocaine as needed.

## 2017-02-25 NOTE — Progress Notes (Signed)
Chief Complaint: Ulcerative Colitis, Painful hemorrhoid  HPI:  Meagan Grimes is a 41 y/o Caucasian female with a past medical history as listed below including ulcerative colitis, who presents to clinic today for urgent add on appointment for a "painful hemorrhoid".   Today, the patient expresses that she has been doing well on her Lialda 4.8 G qd, but Christmas is very stressful and she has been eating the wrong things and on 02/22/17 she started with multiple loose stools per day, up to 8-10, this continued through the night with some bleeding and on the morning of 02/23/17 she developed a terrible rectal pain and "felt a lump back there".  This pain is terrible with sitting and standing and moving around and has left her "sitting in a chair" for much of her Christmas, this is much worse with a bowel movement. She has been doing Sitz baths with epsom salt three times a day and has also been applying Prep H cream multiple times a day, but is still in so much pain that she becomes tearful in the room.    As far as her diarrhea, the patient tells me that this has slowed to 3-4 times a day and she believes it is getting better now that she is back on her regular diet.   Patient denies fever, chills, weight loss, anorexia, abdominal pain, nausea or vomiting.  Past Medical History:  Diagnosis Date  . Anxiety   . Arthritis   . 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 Hypertension     Past Surgical History:  Procedure Laterality Date  . CESAREAN SECTION    . LASER ABLATION      Current Outpatient Medications  Medication Sig Dispense Refill  . budesonide (ENTOCORT EC) 3 MG 24 hr capsule Take 6 mg by mouth daily.    Marland Kitchen glycopyrrolate (ROBINUL) 2 MG tablet Take 1 tablet (2 mg total) by mouth 2 (two) times daily. 60 tablet 6  . HUMIRA PEN 40 MG/0.8ML PNKT     . hydrOXYzine (ATARAX/VISTARIL) 50 MG tablet Take 1 tablet (50 mg total) by mouth 3 (three) times daily as needed for anxiety. 90 tablet 3  .  mesalamine (LIALDA) 1.2 g EC tablet Take 4 tablets by mouth every morning. 120 tablet 11   Current Facility-Administered Medications  Medication Dose Route Frequency Provider Last Rate Last Dose  . 0.9 %  sodium chloride infusion  500 mL Intravenous Continuous Mauri Pole, MD        Allergies as of 02/25/2017  . (No Known Allergies)    Family History  Problem Relation Age of Onset  . Hypertension Mother   . Hypertension Father   . Mental illness Father   . Lung cancer Maternal Grandmother   . Hypertension Maternal Grandfather   . Heart disease Maternal Grandfather   . Heart disease Paternal Grandmother   . Hypertension Paternal Grandmother   . Lung cancer Maternal Aunt   . Colon cancer Neg Hx   . Esophageal cancer Neg Hx   . Rectal cancer Neg Hx   . Liver cancer Neg Hx   . Stomach cancer Neg Hx     Social History   Socioeconomic History  . Marital status: Married    Spouse name: Not on file  . Number of children: 3  . Years of education: Not on file  . Highest education level: Not on file  Social Needs  . Financial resource strain: Not on file  . Food insecurity -  worry: Not on file  . Food insecurity - inability: Not on file  . Transportation needs - medical: Not on file  . Transportation needs - non-medical: Not on file  Occupational History  . Occupation: IT trainer  Tobacco Use  . Smoking status: Former Research scientist (life sciences)  . Smokeless tobacco: Never Used  Substance and Sexual Activity  . Alcohol use: Yes    Alcohol/week: 3.0 oz    Types: 5 Glasses of wine per week  . Drug use: No  . Sexual activity: Yes    Partners: Male    Birth control/protection: None  Other Topics Concern  . Not on file  Social History Narrative  . Not on file    Review of Systems:    Constitutional: No weight loss, fever or chills Cardiovascular: No chest pain Respiratory: No SOB  Gastrointestinal: See HPI and otherwise negative   Physical Exam:  Vital signs: BP 132/90    Pulse 84   Ht 5' 7.5" (1.715 m)   Wt 218 lb (98.9 kg)   BMI 33.64 kg/m    Constitutional:   Pleasant Caucasian female appears to be in NAD, Well developed, Well nourished, alert and cooperative Respiratory: Respirations even and unlabored. Lungs clear to auscultation bilaterally.   No wheezes, crackles, or rhonchi.  Cardiovascular: Normal S1, S2. No MRG. Regular rate and rhythm. No peripheral edema, cyanosis or pallor.  Gastrointestinal:  Soft, nondistended, nontender. No rebound or guarding. Normal bowel sounds. No appreciable masses or hepatomegaly. Rectal:  External exam : Thrombosed right sided hemorrhoid, very painful to palpation Psychiatric:  Demonstrates good judgement and reason without abnormal affect or behaviors.  MOST RECENT LABS AND IMAGING: CBC    Component Value Date/Time   WBC 8.2 01/29/2017 0927   RBC 4.36 01/29/2017 0927   HGB 13.2 01/29/2017 0927   HCT 40.3 01/29/2017 0927   PLT 269.0 01/29/2017 0927   MCV 92.4 01/29/2017 0927   MCH 25.2 (L) 02/22/2010 0531   MCHC 32.9 01/29/2017 0927   RDW 15.3 01/29/2017 0927   LYMPHSABS 2.2 01/29/2017 0927   MONOABS 0.6 01/29/2017 0927   EOSABS 0.2 01/29/2017 0927   BASOSABS 0.0 01/29/2017 0927    CMP     Component Value Date/Time   NA 137 01/29/2017 0927   K 4.9 01/29/2017 0927   CL 103 01/29/2017 0927   CO2 29 01/29/2017 0927   GLUCOSE 96 01/29/2017 0927   BUN 15 01/29/2017 0927   CREATININE 0.95 01/29/2017 0927   CALCIUM 9.3 01/29/2017 0927   PROT 7.4 01/29/2017 0927   ALBUMIN 4.1 01/29/2017 0927   AST 14 01/29/2017 0927   ALT 17 01/29/2017 0927   ALKPHOS 69 01/29/2017 0927   BILITOT 0.6 01/29/2017 0927   GFRNONAA >60 02/22/2010 0531   GFRAA  02/22/2010 0531    >60        The eGFR has been calculated using the MDRD equation. This calculation has not been validated in all clinical situations. eGFR's persistently <60 mL/min signify possible Chronic Kidney Disease.    Assessment: 1. Thrombosed  Hemorrhoid: Seen at time of exam today, verified by Dr. Carlean Purl 2. Ulcerative Colitis: flare with diarrhea and some bleeding, now improving  Plan: 1. Referred patient to [facility] for urgent add on appointment, she is scheduled for Friday, 02/27/17 in the morning. 2. Prescribed Hydrocortisone ointment BID x7-14 days 3.  Prescribed Lidocaine ointment to be applied prn for pain 4. Continue Sitz baths 2-3 times a day for 15-20 min 5. For now  continue on Lialda 4.8 G qd, if diarrhea and symptoms do not completely subside over the next week, patient to call our clinic and let us know 6. Patient to follow in clinic as needed with Dr. Silverio Decamp or myself.  Ellouise Newer, PA-C [facility] 02/25/2017, 1:23 PM  Cc: Vivi Barrack, MD

## 2017-02-26 NOTE — Progress Notes (Signed)
Reviewed and agree with documentation and assessment and plan. K. Veena Noralee Dutko , MD   

## 2017-04-01 DIAGNOSIS — Z1231 Encounter for screening mammogram for malignant neoplasm of breast: Secondary | ICD-10-CM | POA: Diagnosis not present

## 2017-04-01 DIAGNOSIS — Z6833 Body mass index (BMI) 33.0-33.9, adult: Secondary | ICD-10-CM | POA: Diagnosis not present

## 2017-04-01 DIAGNOSIS — Z01419 Encounter for gynecological examination (general) (routine) without abnormal findings: Secondary | ICD-10-CM | POA: Diagnosis not present

## 2017-04-10 DIAGNOSIS — R5382 Chronic fatigue, unspecified: Secondary | ICD-10-CM | POA: Diagnosis not present

## 2017-04-10 DIAGNOSIS — L4059 Other psoriatic arthropathy: Secondary | ICD-10-CM | POA: Diagnosis not present

## 2017-04-10 DIAGNOSIS — K51811 Other ulcerative colitis with rectal bleeding: Secondary | ICD-10-CM | POA: Diagnosis not present

## 2017-04-10 DIAGNOSIS — L409 Psoriasis, unspecified: Secondary | ICD-10-CM | POA: Diagnosis not present

## 2017-06-13 ENCOUNTER — Other Ambulatory Visit: Payer: Self-pay | Admitting: Physician Assistant

## 2017-06-24 ENCOUNTER — Encounter: Payer: Self-pay | Admitting: Physician Assistant

## 2017-06-24 ENCOUNTER — Ambulatory Visit (INDEPENDENT_AMBULATORY_CARE_PROVIDER_SITE_OTHER): Payer: BLUE CROSS/BLUE SHIELD | Admitting: Physician Assistant

## 2017-06-24 VITALS — BP 130/80 | HR 86 | Temp 98.5°F | Ht 67.5 in | Wt 213.2 lb

## 2017-06-24 DIAGNOSIS — J020 Streptococcal pharyngitis: Secondary | ICD-10-CM

## 2017-06-24 DIAGNOSIS — J0100 Acute maxillary sinusitis, unspecified: Secondary | ICD-10-CM

## 2017-06-24 LAB — POCT RAPID STREP A (OFFICE): RAPID STREP A SCREEN: POSITIVE — AB

## 2017-06-24 MED ORDER — AMOXICILLIN-POT CLAVULANATE 875-125 MG PO TABS: 1.0000 | ORAL_TABLET | Freq: Two times a day (BID) | ORAL | 0 refills | Status: DC

## 2017-06-24 NOTE — Patient Instructions (Signed)
It was great to see you!  Use medication as prescribed: Augmentin Push fluids and get plenty of rest. Please return if you are not improving as expected, or if you have high fevers (>101.5) or difficulty swallowing or worsening productive cough.  Call clinic with questions.  I hope you start feeling better soon!

## 2017-06-24 NOTE — Progress Notes (Signed)
Meagan Grimes is a 42 y.o. female here for a new problem.  I acted as a Education administrator for [employer], PA-C Anselmo Pickler, LPN   History of Present Illness:   Chief Complaint  Patient presents with  . Sore Throat    Sore Throat   This is a new problem. Episode onset: Started Sunday night. The problem has been gradually worsening. Neither side of throat is experiencing more pain than the other. There has been no fever. The pain is at a severity of 7/10. The pain is moderate. Associated symptoms include congestion (Nasal), coughing, diarrhea (Monday), ear pain, headaches, a hoarse voice, a plugged ear sensation, shortness of breath and trouble swallowing. Pertinent negatives include no abdominal pain, ear discharge, neck pain, swollen glands or vomiting. She has had exposure to strep. Exposure to: Son had strep 2 weeks ago. She has tried acetaminophen (Mucinex, Nyquil at night) for the symptoms. The treatment provided no relief.   She endorses sinus pressure and congestion.   Past Medical History:  Diagnosis Date  . Anxiety   . Arthritis   . 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 Hypertension      Social History   Socioeconomic History  . Marital status: Married    Spouse name: Not on file  . Number of children: 3  . Years of education: Not on file  . Highest education level: Not on file  Occupational History  . Occupation: IT trainer  Social Needs  . Financial resource strain: Not on file  . Food insecurity:    Worry: Not on file    Inability: Not on file  . Transportation needs:    Medical: Not on file    Non-medical: Not on file  Tobacco Use  . Smoking status: Former Research scientist (life sciences)  . Smokeless tobacco: Never Used  Substance and Sexual Activity  . Alcohol use: Yes    Alcohol/week: 3.0 oz    Types: 5 Glasses of wine per week  . Drug use: No  . Sexual activity: Yes    Partners: Male    Birth control/protection: None  Lifestyle  . Physical activity:    Days per week: Not  on file    Minutes per session: Not on file  . Stress: Not on file  Relationships  . Social connections:    Talks on phone: Not on file    Gets together: Not on file    Attends religious service: Not on file    Active member of club or organization: Not on file    Attends meetings of clubs or organizations: Not on file    Relationship status: Not on file  . Intimate partner violence:    Fear of current or ex partner: Not on file    Emotionally abused: Not on file    Physically abused: Not on file    Forced sexual activity: Not on file  Other Topics Concern  . Not on file  Social History Narrative  . Not on file    Past Surgical History:  Procedure Laterality Date  . CESAREAN SECTION    . LASER ABLATION      Family History  Problem Relation Age of Onset  . Hypertension Mother   . Hypertension Father   . Mental illness Father   . Lung cancer Maternal Grandmother   . Hypertension Maternal Grandfather   . Heart disease Maternal Grandfather   . Heart disease Paternal Grandmother   . Hypertension Paternal Grandmother   . Lung cancer  Maternal Aunt   . Colon cancer Neg Hx   . Esophageal cancer Neg Hx   . Rectal cancer Neg Hx   . Liver cancer Neg Hx   . Stomach cancer Neg Hx     No Known Allergies  Current Medications:   Current Outpatient Medications:  .  glycopyrrolate (ROBINUL) 2 MG tablet, TAKE 1 TABLET (2 MG TOTAL) BY MOUTH 2 (TWO) TIMES DAILY., Disp: 60 tablet, Rfl: 6 .  HUMIRA PEN 40 MG/0.8ML PNKT, , Disp: , Rfl:  .  hydrocortisone cream 0.5 %, Apply 1 application topically 2 (two) times daily. (Patient taking differently: Apply 1 application topically as needed. ), Disp: 30 g, Rfl: 0 .  hydrOXYzine (ATARAX/VISTARIL) 50 MG tablet, Take 1 tablet (50 mg total) by mouth 3 (three) times daily as needed for anxiety., Disp: 90 tablet, Rfl: 3 .  mesalamine (LIALDA) 1.2 g EC tablet, Take 4 tablets by mouth every morning., Disp: 120 tablet, Rfl: 11 .   amoxicillin-clavulanate (AUGMENTIN) 875-125 MG tablet, Take 1 tablet by mouth 2 (two) times daily., Disp: 20 tablet, Rfl: 0  Current Facility-Administered Medications:  .  0.9 %  sodium chloride infusion, 500 mL, Intravenous, Continuous, Nandigam, Kavitha V, MD   Review of Systems:   Review of Systems  HENT: Positive for congestion (Nasal), ear pain, hoarse voice and trouble swallowing. Negative for ear discharge.   Respiratory: Positive for cough and shortness of breath.   Gastrointestinal: Positive for diarrhea (Monday). Negative for abdominal pain and vomiting.  Musculoskeletal: Negative for neck pain.  Neurological: Positive for headaches.    Vitals:   Vitals:   06/24/17 1448  BP: 130/80  Pulse: 86  Temp: 98.5 F (36.9 C)  TempSrc: Oral  SpO2: 97%  Weight: 213 lb 4 oz (96.7 kg)  Height: 5' 7.5" (1.715 m)     Body mass index is 32.91 kg/m.  Physical Exam:   Physical Exam  Constitutional: She appears well-developed. She is cooperative.  Non-toxic appearance. She does not have a sickly appearance. She does not appear ill. No distress.  HENT:  Head: Normocephalic and atraumatic.  Right Ear: Tympanic membrane, external ear and ear canal normal. Tympanic membrane is not erythematous, not retracted and not bulging.  Left Ear: Tympanic membrane, external ear and ear canal normal. Tympanic membrane is not erythematous, not retracted and not bulging.  Nose: Mucosal edema and rhinorrhea present. Right sinus exhibits maxillary sinus tenderness. Right sinus exhibits no frontal sinus tenderness. Left sinus exhibits maxillary sinus tenderness. Left sinus exhibits no frontal sinus tenderness.  Mouth/Throat: Uvula is midline and mucous membranes are normal. Posterior oropharyngeal erythema present. No posterior oropharyngeal edema. Tonsils are 1+ on the right. Tonsils are 1+ on the left. No tonsillar exudate.  Eyes: Conjunctivae and lids are normal.  Neck: Trachea normal.   Cardiovascular: Normal rate, regular rhythm, S1 normal, S2 normal and normal heart sounds.  Pulmonary/Chest: Effort normal and breath sounds normal. She has no decreased breath sounds. She has no wheezes. She has no rhonchi. She has no rales.  Lymphadenopathy:    She has no cervical adenopathy.  Neurological: She is alert.  Skin: Skin is warm, dry and intact.  Psychiatric: She has a normal mood and affect. Her speech is normal and behavior is normal.  Nursing note and vitals reviewed.   Results for orders placed or performed in visit on 06/24/17  POCT rapid strep A  Result Value Ref Range   Rapid Strep A Screen Positive (A) Negative  Assessment and Plan:    Jericca was seen today for sore throat.  Diagnoses and all orders for this visit:  Strep pharyngitis -     POCT rapid strep A  Acute maxillary sinusitis, recurrence not specified  Other orders -     amoxicillin-clavulanate (AUGMENTIN) 875-125 MG tablet; Take 1 tablet by mouth 2 (two) times daily.   No red flags on exam.  Will initiate Augmentin per orders given positive strep test and early sinusitis. Discussed taking medications as prescribed. Reviewed return precautions including worsening fever, SOB, worsening cough or other concerns. Push fluids and rest. I recommend that patient follow-up if symptoms worsen or persist despite treatment x 7-10 days, sooner if needed.   . Reviewed expectations re: course of current medical issues. . Discussed self-management of symptoms. . Outlined signs and symptoms indicating need for more acute intervention. . Patient verbalized understanding and all questions were answered. . See orders for this visit as documented in the electronic medical record. . Patient received an After-Visit Summary.  CMA or LPN served as scribe during this visit. History, Physical, and Plan performed by medical provider. Documentation and orders reviewed and attested to.  Inda Coke, PA-C

## 2017-08-14 DIAGNOSIS — K51811 Other ulcerative colitis with rectal bleeding: Secondary | ICD-10-CM | POA: Diagnosis not present

## 2017-08-14 DIAGNOSIS — L4059 Other psoriatic arthropathy: Secondary | ICD-10-CM | POA: Diagnosis not present

## 2017-08-14 DIAGNOSIS — L409 Psoriasis, unspecified: Secondary | ICD-10-CM | POA: Diagnosis not present

## 2017-10-07 ENCOUNTER — Encounter: Payer: Self-pay | Admitting: Gastroenterology

## 2017-12-14 DIAGNOSIS — L409 Psoriasis, unspecified: Secondary | ICD-10-CM | POA: Diagnosis not present

## 2017-12-14 DIAGNOSIS — L4059 Other psoriatic arthropathy: Secondary | ICD-10-CM | POA: Diagnosis not present

## 2017-12-14 DIAGNOSIS — K51811 Other ulcerative colitis with rectal bleeding: Secondary | ICD-10-CM | POA: Diagnosis not present

## 2017-12-14 DIAGNOSIS — R5382 Chronic fatigue, unspecified: Secondary | ICD-10-CM | POA: Diagnosis not present

## 2017-12-28 ENCOUNTER — Ambulatory Visit: Payer: BLUE CROSS/BLUE SHIELD | Admitting: Gastroenterology

## 2017-12-29 ENCOUNTER — Encounter: Payer: Self-pay | Admitting: Gastroenterology

## 2018-01-27 ENCOUNTER — Other Ambulatory Visit: Payer: Self-pay | Admitting: Gastroenterology

## 2018-02-01 ENCOUNTER — Ambulatory Visit: Payer: BLUE CROSS/BLUE SHIELD | Admitting: Gastroenterology

## 2018-02-17 ENCOUNTER — Encounter: Payer: Self-pay | Admitting: Gastroenterology

## 2018-02-17 ENCOUNTER — Other Ambulatory Visit: Payer: BLUE CROSS/BLUE SHIELD

## 2018-02-17 ENCOUNTER — Ambulatory Visit: Payer: BLUE CROSS/BLUE SHIELD | Admitting: Gastroenterology

## 2018-02-17 VITALS — BP 140/80 | HR 60 | Ht 66.0 in | Wt 224.6 lb

## 2018-02-17 DIAGNOSIS — K625 Hemorrhage of anus and rectum: Secondary | ICD-10-CM

## 2018-02-17 DIAGNOSIS — K602 Anal fissure, unspecified: Secondary | ICD-10-CM

## 2018-02-17 DIAGNOSIS — K5180 Other ulcerative colitis without complications: Secondary | ICD-10-CM | POA: Diagnosis not present

## 2018-02-17 DIAGNOSIS — R197 Diarrhea, unspecified: Secondary | ICD-10-CM

## 2018-02-17 MED ORDER — AMBULATORY NON FORMULARY MEDICATION: 1 refills | Status: DC

## 2018-02-17 MED ORDER — MESALAMINE ER 0.375 G PO CP24: 750.0000 mg | ORAL_CAPSULE | Freq: Every day | ORAL | 6 refills | Status: DC

## 2018-02-17 MED ORDER — MESALAMINE 1.2 G PO TBEC: 4.8000 g | DELAYED_RELEASE_TABLET | Freq: Every day | ORAL | 3 refills | Status: DC

## 2018-02-17 NOTE — Patient Instructions (Addendum)
  Go to the basement for labs today  Take Benefiber 1 teaspoon three times a day   We will refill Mesalamine for you today   We have sent a prescription for nitroglycerin 0.125% gel to [facility]. You should apply a pea size amount to your rectum three times daily x 6-8 weeks.  [facility]'s information is below: Address: [address] [place], [place] [address]  [phone]  *Please DO NOT go directly from our office to pick up this medication! Give the pharmacy 1 day to process the prescription as this is compounded at takes time to make.  If you are age 43 or older, your body mass index should be between 23-30. Your Body mass index is 36.25 kg/m. If this is out of the aforementioned range listed, please consider follow up with your Primary Care Provider.  If you are age 45 or younger, your body mass index should be between 19-25. Your Body mass index is 36.25 kg/m. If this is out of the aformentioned range listed, please consider follow up with your Primary Care Provider.

## 2018-02-17 NOTE — Progress Notes (Signed)
Meagan Grimes    [id number]    04/16/1975  Primary Care Physician:Parker, Algis Greenhouse, MD  Referring Physician: Vivi Barrack, MD [address] [place], [place] [address]  Chief complaint:  Ulcerative colitis  HPI: 64 yr F ulcerative colitis here for follow-up visit.  Last seen November 2018. She was on antibiotics around Halloween for sinus infection, had diarrhea while taking antibiotics. Subsequently had constipation, thinks due to excessive use of decongestants.  In the last 1 month she has been having intermittent diarrhea, also some nocturnal bowel movements and bright red blood per rectum, mixed in stool and when she wipes after a bowel movement.  Prior to starting Lialda she was having rectal bleeding and mucus, her symptoms are significantly less compared to what they were a year ago. Denies any abdominal pain, has generalized bloating and discomfort intermittently.  No nausea, vomiting, dysphagia, loss of appetite or weight loss.  Colonoscopy October 21, 2016 diffuse colitis in the left side of colon, biopsies showed moderate inflammation consistent with ulcerative colitis.   Outpatient Encounter Medications as of 02/17/2018  Medication Sig  . amoxicillin-clavulanate (AUGMENTIN) 875-125 MG tablet Take 1 tablet by mouth 2 (two) times daily.  Marland Kitchen glycopyrrolate (ROBINUL) 2 MG tablet TAKE 1 TABLET (2 MG TOTAL) BY MOUTH 2 (TWO) TIMES DAILY.  Marland Kitchen HUMIRA PEN 40 MG/0.8ML PNKT   . hydrocortisone cream 0.5 % Apply 1 application topically 2 (two) times daily. (Patient taking differently: Apply 1 application topically as needed. )  . hydrOXYzine (ATARAX/VISTARIL) 50 MG tablet Take 1 tablet (50 mg total) by mouth 3 (three) times daily as needed for anxiety.  . mesalamine (LIALDA) 1.2 g EC tablet TAKE 4 TABLETS BY MOUTH EVERY MORNING.   Facility-Administered Encounter Medications as of 02/17/2018  Medication  . 0.9 %  sodium chloride infusion    Allergies as of 02/17/2018  .  (No Known Allergies)    Past Medical History:  Diagnosis Date  . Anxiety   . Arthritis   . 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 Crohn's disease ([place])   . Hypertension     Past Surgical History:  Procedure Laterality Date  . CESAREAN SECTION    . LASER ABLATION      Family History  Problem Relation Age of Onset  . Hypertension Mother   . Hypertension Father   . Mental illness Father   . Lung cancer Maternal Grandmother   . Hypertension Maternal Grandfather   . Heart disease Maternal Grandfather   . Heart disease Paternal Grandmother   . Hypertension Paternal Grandmother   . Lung cancer Maternal Aunt   . Colon cancer Neg Hx   . Esophageal cancer Neg Hx   . Rectal cancer Neg Hx   . Liver cancer Neg Hx   . Stomach cancer Neg Hx     Social History   Socioeconomic History  . Marital status: Married    Spouse name: Not on file  . Number of children: 3  . Years of education: Not on file  . Highest education level: Not on file  Occupational History  . Occupation: IT trainer  Social Needs  . Financial resource strain: Not on file  . Food insecurity:    Worry: Not on file    Inability: Not on file  . Transportation needs:    Medical: Not on file    Non-medical: Not on file  Tobacco Use  . Smoking status: Former Research scientist (life sciences)  .  Smokeless tobacco: Never Used  Substance and Sexual Activity  . Alcohol use: Yes    Alcohol/week: 5.0 standard drinks    Types: 5 Glasses of wine per week  . Drug use: No  . Sexual activity: Yes    Partners: Male    Birth control/protection: None  Lifestyle  . Physical activity:    Days per week: Not on file    Minutes per session: Not on file  . Stress: Not on file  Relationships  . Social connections:    Talks on phone: Not on file    Gets together: Not on file    Attends religious service: Not on file    Active member of club or organization: Not on file    Attends meetings of clubs or organizations: Not on file    Relationship  status: Not on file  . Intimate partner violence:    Fear of current or ex partner: Not on file    Emotionally abused: Not on file    Physically abused: Not on file    Forced sexual activity: Not on file  Other Topics Concern  . Not on file  Social History Narrative  . Not on file      Review of systems: Review of Systems  Constitutional: Negative for fever and chills.  HENT: Negative.   Eyes: Negative for blurred vision.  Respiratory: Negative for cough, shortness of breath and wheezing.   Cardiovascular: Negative for chest pain and palpitations.  Gastrointestinal: as per HPI Genitourinary: Negative for dysuria, urgency, frequency and hematuria.  Musculoskeletal: Positive for myalgias, back pain and joint pain.  Skin: Negative for itching and rash.  Neurological: Negative for dizziness, tremors, focal weakness, seizures and loss of consciousness.  Endo/Heme/Allergies: Positive for seasonal allergies.  Psychiatric/Behavioral: Negative for depression, suicidal ideas and hallucinations.  Positive for anxiety All other systems reviewed and are negative.   Physical Exam: Vitals:   02/17/18 1340  BP: 140/80  Pulse: 60  SpO2: 97%   Body mass index is 36.25 kg/m. Gen:      No acute distress HEENT:  EOMI, sclera anicteric Neck:     No masses; no thyromegaly Lungs:    Clear to auscultation bilaterally; normal respiratory effort CV:         Regular rate and rhythm; no murmurs Abd:      + bowel sounds; soft, non-tender; no palpable masses, no distension Ext:    No edema; adequate peripheral perfusion Skin:      Warm and dry; no rash Neuro: alert and oriented x 3 Psych: normal mood and affect Rectal exam: Increased anal sphincter tone with tenderness, positive posterior anal fissure   Data Reviewed:  Reviewed labs, radiology imaging, old records and pertinent past GI work up   Assessment and Plan/Recommendations:  42 year old female with history of left-sided ulcerative  colitis with complaints of intermittent rectal bleeding and diarrhea We will check C. difficile given history of recent antibiotic use to exclude C. difficile colitis Ulcerative colitis: Continue Lialda 4.8 g daily Rectal exam with evidence of posterior anal fissure: Start rectal nitroglycerin 0.125% small pea-sized amount 3 times daily for 6 to 8 weeks Benefiber 1 teaspoon 3 times daily with meals We will plan colonoscopy in 6 to 8 weeks to assess endoscopic healing and degree of inflammation.  If continues to have persistent active inflammation, will consider starting biologics Return in 4 to 6 weeks or sooner if needed   25 minutes was spent face-to-face with the patient.  Greater than 50% of the time used for counseling as well as treatment plan and follow-up. She had multiple questions which were answered to her satisfaction  K. Denzil Magnuson , MD [phone]    CC: Vivi Barrack, MD

## 2018-04-15 ENCOUNTER — Ambulatory Visit: Payer: BLUE CROSS/BLUE SHIELD | Admitting: Family Medicine

## 2018-04-15 ENCOUNTER — Encounter: Payer: Self-pay | Admitting: Family Medicine

## 2018-04-15 VITALS — BP 148/98 | HR 92 | Temp 98.7°F | Ht 66.0 in | Wt 210.2 lb

## 2018-04-15 DIAGNOSIS — Z6833 Body mass index (BMI) 33.0-33.9, adult: Secondary | ICD-10-CM

## 2018-04-15 DIAGNOSIS — J020 Streptococcal pharyngitis: Secondary | ICD-10-CM | POA: Diagnosis not present

## 2018-04-15 DIAGNOSIS — R059 Cough, unspecified: Secondary | ICD-10-CM

## 2018-04-15 DIAGNOSIS — R030 Elevated blood-pressure reading, without diagnosis of hypertension: Secondary | ICD-10-CM | POA: Diagnosis not present

## 2018-04-15 DIAGNOSIS — R6889 Other general symptoms and signs: Secondary | ICD-10-CM | POA: Diagnosis not present

## 2018-04-15 DIAGNOSIS — K5180 Other ulcerative colitis without complications: Secondary | ICD-10-CM

## 2018-04-15 DIAGNOSIS — K5190 Ulcerative colitis, unspecified, without complications: Secondary | ICD-10-CM | POA: Insufficient documentation

## 2018-04-15 DIAGNOSIS — R05 Cough: Secondary | ICD-10-CM

## 2018-04-15 LAB — POCT INFLUENZA A/B
Influenza A, POC: NEGATIVE
Influenza B, POC: NEGATIVE

## 2018-04-15 LAB — POCT RAPID STREP A (OFFICE): RAPID STREP A SCREEN: POSITIVE — AB

## 2018-04-15 MED ORDER — AMOXICILLIN 875 MG PO TABS: 875.0000 mg | ORAL_TABLET | Freq: Two times a day (BID) | ORAL | 0 refills | Status: DC

## 2018-04-15 MED ORDER — IPRATROPIUM BROMIDE 0.06 % NA SOLN: 2.0000 | Freq: Four times a day (QID) | NASAL | 0 refills | Status: DC

## 2018-04-15 MED ORDER — BENZONATATE 200 MG PO CAPS: 200.0000 mg | ORAL_CAPSULE | Freq: Two times a day (BID) | ORAL | 0 refills | Status: DC | PRN

## 2018-04-15 NOTE — Assessment & Plan Note (Signed)
Stable.  Continue management per GI.

## 2018-04-15 NOTE — Patient Instructions (Signed)
Start the amoxicillin, Atrovent, and Tessalon.  Please stay well hydrated.  You can take tylenol and/or motrin as needed for low grade fever and pain.  Please let me know if your symptoms worsen or fail to improve.  Please keep and eye on your blood pressure let us know if persistently 140/90 or higher.  Take care, Dr Jerline Pain

## 2018-04-15 NOTE — Progress Notes (Signed)
   Chief Complaint:  Meagan Grimes is a 43 y.o. female who presents for same day appointment with a chief complaint of cough.   Assessment/Plan:  Strep pharyngitis Rapid strep positive.  Start amoxicillin.  Start Atrovent for rhinorrhea/sinus congestion.  Start Tessalon for cough.  Encouraged good oral hydration.  Recommended Tylenol/or Motrin as needed.  Discussed reasons to return to care.  Follow-up as needed.  BMI 33 Patient down about 14 pounds over the last couple of months.  Congratulated patient on this.  Encouraged continued lifestyle modifications.  Elevated blood pressure reading Elevated today in setting of acute illness.  Has been at goal the last few office visits.  Will not start medication today.  Continue home blood pressure monitoring with goal 140/90 or lower.  Ulcerative colitis ([place]) Stable.  Continue management per GI.     Subjective:  HPI:  Cough, acute problem Started 2 days ago. Associated with sore throat, rhinorrhea, and fatigue. Son was recently diagnosed with strep. Has been using dayquil which has not helped. Symptoms wors at night.  No other treatments tried.  No other obvious alleviating or aggravating factors.   ROS: Per HPI  PMH: She reports that she has quit smoking. She has never used smokeless tobacco. She reports current alcohol use of about 5.0 standard drinks of alcohol per week. She reports that she does not use drugs.      Objective:  Physical Exam: BP (!) 148/98 (BP Location: Left Arm, Patient Position: Sitting, Cuff Size: Normal)   Pulse 92   Temp 98.7 F (37.1 C) (Oral)   Ht 5' 6"  (1.676 m)   Wt 210 lb 3.2 oz (95.3 kg)   SpO2 97%   BMI 33.93 kg/m   Wt Readings from Last 3 Encounters:  04/15/18 210 lb 3.2 oz (95.3 kg)  02/17/18 224 lb 9.6 oz (101.9 kg)  06/24/17 213 lb 4 oz (96.7 kg)  Gen: NAD, resting comfortably HEENT: TMs clear.  OP erythematous no exudate. CV: Regular rate and rhythm with no murmurs appreciated Pulm:  Normal work of breathing, clear to auscultation bilaterally with no crackles, wheezes, or rhonchi  Results for orders placed or performed in visit on 04/15/18 (from the past 24 hour(s))  POCT rapid strep A     Status: Abnormal   Collection Time: 04/15/18  1:11 PM  Result Value Ref Range   Rapid Strep A Screen Positive (A) Negative  POCT Influenza A/B     Status: None   Collection Time: 04/15/18  1:12 PM  Result Value Ref Range   Influenza A, POC Negative Negative   Influenza B, POC Negative Negative        Meagan Grimes M. Meagan Pain, MD 04/15/2018 1:23 PM

## 2018-05-05 DIAGNOSIS — Z01419 Encounter for gynecological examination (general) (routine) without abnormal findings: Secondary | ICD-10-CM | POA: Diagnosis not present

## 2018-05-05 DIAGNOSIS — Z6833 Body mass index (BMI) 33.0-33.9, adult: Secondary | ICD-10-CM | POA: Diagnosis not present

## 2018-05-05 DIAGNOSIS — Z1231 Encounter for screening mammogram for malignant neoplasm of breast: Secondary | ICD-10-CM | POA: Diagnosis not present

## 2018-05-17 ENCOUNTER — Other Ambulatory Visit: Payer: Self-pay | Admitting: Gastroenterology

## 2018-09-08 DIAGNOSIS — Z20828 Contact with and (suspected) exposure to other viral communicable diseases: Secondary | ICD-10-CM | POA: Diagnosis not present

## 2018-09-17 DIAGNOSIS — K51811 Other ulcerative colitis with rectal bleeding: Secondary | ICD-10-CM | POA: Diagnosis not present

## 2018-09-17 DIAGNOSIS — L4059 Other psoriatic arthropathy: Secondary | ICD-10-CM | POA: Diagnosis not present

## 2018-09-17 DIAGNOSIS — L409 Psoriasis, unspecified: Secondary | ICD-10-CM | POA: Diagnosis not present

## 2018-09-17 DIAGNOSIS — R5382 Chronic fatigue, unspecified: Secondary | ICD-10-CM | POA: Diagnosis not present

## 2018-10-12 DIAGNOSIS — L400 Psoriasis vulgaris: Secondary | ICD-10-CM | POA: Diagnosis not present

## 2019-01-29 DIAGNOSIS — Z20828 Contact with and (suspected) exposure to other viral communicable diseases: Secondary | ICD-10-CM | POA: Diagnosis not present

## 2019-03-01 ENCOUNTER — Other Ambulatory Visit: Payer: BLUE CROSS/BLUE SHIELD

## 2019-03-02 ENCOUNTER — Ambulatory Visit: Payer: BC Managed Care – PPO | Attending: Internal Medicine

## 2019-03-02 DIAGNOSIS — U071 COVID-19: Secondary | ICD-10-CM | POA: Diagnosis not present

## 2019-03-02 DIAGNOSIS — R238 Other skin changes: Secondary | ICD-10-CM

## 2019-03-03 LAB — NOVEL CORONAVIRUS, NAA: SARS-CoV-2, NAA: DETECTED — AB

## 2019-03-21 DIAGNOSIS — L4059 Other psoriatic arthropathy: Secondary | ICD-10-CM | POA: Diagnosis not present

## 2019-03-21 DIAGNOSIS — L409 Psoriasis, unspecified: Secondary | ICD-10-CM | POA: Diagnosis not present

## 2019-03-21 DIAGNOSIS — K51811 Other ulcerative colitis with rectal bleeding: Secondary | ICD-10-CM | POA: Diagnosis not present

## 2019-03-21 DIAGNOSIS — R5382 Chronic fatigue, unspecified: Secondary | ICD-10-CM | POA: Diagnosis not present

## 2019-04-08 DIAGNOSIS — Z111 Encounter for screening for respiratory tuberculosis: Secondary | ICD-10-CM | POA: Diagnosis not present

## 2019-04-08 DIAGNOSIS — L4059 Other psoriatic arthropathy: Secondary | ICD-10-CM | POA: Diagnosis not present

## 2019-05-03 DIAGNOSIS — Z20828 Contact with and (suspected) exposure to other viral communicable diseases: Secondary | ICD-10-CM | POA: Diagnosis not present

## 2019-05-17 DIAGNOSIS — I10 Essential (primary) hypertension: Secondary | ICD-10-CM | POA: Diagnosis not present

## 2019-05-17 DIAGNOSIS — Z20822 Contact with and (suspected) exposure to covid-19: Secondary | ICD-10-CM | POA: Diagnosis not present

## 2019-05-20 ENCOUNTER — Telehealth: Payer: Self-pay | Admitting: Family Medicine

## 2019-05-20 NOTE — Telephone Encounter (Signed)
I called the pt and informed her it is not advised to await a visit for months with elevated BP and offered an appt with another provider here if she preferred or suggested she see her previous PCP for help.  Patient stated she does not mind seeing another provider at our office and preferred to have a female physician as her last PCP was a female.  Appt scheduled for next week with Dr Jerilee Hoh.

## 2019-05-20 NOTE — Telephone Encounter (Signed)
Pt recently went to Urgent Care and they noticed that her BP is high and they stated she needs to find a PCP to get it under control. Pt is scheduled for July 12 at 12:30....Marland KitchenMarland Kitchenthe patient is wondering if it is possible to be seen sooner? If not she understands  Pt can be reached at [phone]

## 2019-05-24 ENCOUNTER — Telehealth: Payer: BC Managed Care – PPO | Admitting: Internal Medicine

## 2019-05-25 ENCOUNTER — Other Ambulatory Visit: Payer: Self-pay

## 2019-05-25 ENCOUNTER — Ambulatory Visit (INDEPENDENT_AMBULATORY_CARE_PROVIDER_SITE_OTHER): Payer: BC Managed Care – PPO | Admitting: Internal Medicine

## 2019-05-25 ENCOUNTER — Encounter: Payer: Self-pay | Admitting: Internal Medicine

## 2019-05-25 VITALS — BP 140/90 | HR 91 | Temp 97.6°F | Ht 66.0 in | Wt 213.7 lb

## 2019-05-25 DIAGNOSIS — I10 Essential (primary) hypertension: Secondary | ICD-10-CM | POA: Diagnosis not present

## 2019-05-25 DIAGNOSIS — K5180 Other ulcerative colitis without complications: Secondary | ICD-10-CM | POA: Diagnosis not present

## 2019-05-25 DIAGNOSIS — E669 Obesity, unspecified: Secondary | ICD-10-CM

## 2019-05-25 DIAGNOSIS — M069 Rheumatoid arthritis, unspecified: Secondary | ICD-10-CM

## 2019-05-25 MED ORDER — HYDROCHLOROTHIAZIDE 25 MG PO TABS: 25.0000 mg | ORAL_TABLET | Freq: Every day | ORAL | 1 refills | Status: DC

## 2019-05-25 NOTE — Progress Notes (Signed)
Established Patient Office Visit     This visit occurred during the SARS-CoV-2 public health emergency.  Safety protocols were in place, including screening questions prior to the visit, additional usage of staff PPE, and extensive cleaning of exam room while observing appropriate contact time as indicated for disinfecting solutions.    CC/Reason for Visit: Establish care, discuss elevated BP  HPI: Meagan Grimes is a 44 y.o. female who is coming in today for the above mentioned reasons. Past Medical History is significant for: UC on mesalamine followed by GI and RA on Humira followed by rheumatology. She was told during an UC visit that her BP was high and was advised f/u with PCP. BP over past few days as follows:  140/108 150/99 143/103 157/99 147/93 160/108 158/99 141/103 160/98  Last Friday she scheduled a telemedicine appointment and was prescribed HCTZ 12.5 mg. CMP, CBC, FLP, TSH was ordered (results are not back yet).  She works at [employer] from home in the past year, is married, has 3 children, used to be a smoker but quit 8 years ago, drinks 2-3 mixed beverages on the weekends.   Past Medical/Surgical History: Past Medical History:  Diagnosis Date  . Anxiety   . Arthritis   . 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 Crohn's disease ([place])   . Hypertension   . Rheumatoid arthritis [facility])     Past Surgical History:  Procedure Laterality Date  . CESAREAN SECTION    . LASER ABLATION      Social History:  reports that she has quit smoking. She has never used smokeless tobacco. She reports current alcohol use of about 5.0 standard drinks of alcohol per week. She reports that she does not use drugs.  Allergies: No Known Allergies  Family History:  Family History  Problem Relation Age of Onset  . Hypertension Mother   . Hypertension Father   . Mental illness Father   . Lung cancer Maternal Grandmother   . Hypertension Maternal Grandfather   . Heart disease  Maternal Grandfather   . Heart disease Paternal Grandmother   . Hypertension Paternal Grandmother   . Lung cancer Maternal Aunt   . Colon cancer Neg Hx   . Esophageal cancer Neg Hx   . Rectal cancer Neg Hx   . Liver cancer Neg Hx   . Stomach cancer Neg Hx      Current Outpatient Medications:  .  HUMIRA PEN 40 MG/0.4ML PNKT, , Disp: , Rfl:  .  hydrOXYzine (ATARAX/VISTARIL) 50 MG tablet, Take 1 tablet (50 mg total) by mouth 3 (three) times daily as needed for anxiety., Disp: 90 tablet, Rfl: 3 .  mesalamine (LIALDA) 1.2 g EC tablet, TAKE 4 TABLETS BY MOUTH EVERY MORNING., Disp: 360 tablet, Rfl: 3 .  hydrochlorothiazide (HYDRODIURIL) 25 MG tablet, Take 1 tablet (25 mg total) by mouth daily., Disp: 90 tablet, Rfl: 1  Current Facility-Administered Medications:  .  0.9 %  sodium chloride infusion, 500 mL, Intravenous, Continuous, Nandigam, Kavitha V, MD  Review of Systems:  Constitutional: Denies fever, chills, diaphoresis, appetite change and fatigue.  HEENT: Denies photophobia, eye pain, redness, hearing loss, ear pain, congestion, sore throat, rhinorrhea, sneezing, mouth sores, trouble swallowing, neck pain, neck stiffness and tinnitus.   Respiratory: Denies SOB, DOE, cough, chest tightness,  and wheezing.   Cardiovascular: Denies chest pain, palpitations and leg swelling.  Gastrointestinal: Denies nausea, vomiting, abdominal pain, diarrhea, constipation, blood in stool and abdominal distention.  Genitourinary: Denies dysuria,  urgency, frequency, hematuria, flank pain and difficulty urinating.  Endocrine: Denies: hot or cold intolerance, sweats, changes in hair or nails, polyuria, polydipsia. Musculoskeletal: Denies myalgias, back pain, joint swelling, arthralgias and gait problem.  Skin: Denies pallor, rash and wound.  Neurological: Denies dizziness, seizures, syncope, weakness, light-headedness, numbness and headaches.  Hematological: Denies adenopathy. Easy bruising, personal or family  bleeding history  Psychiatric/Behavioral: Denies suicidal ideation, mood changes, confusion, nervousness, sleep disturbance and agitation    Physical Exam: Vitals:   05/25/19 1323  BP: 140/90  Pulse: 91  Temp: 97.6 F (36.4 C)  TempSrc: Temporal  SpO2: 98%  Weight: 213 lb 11.2 oz (96.9 kg)  Height: 5' 6"  (1.676 m)    Body mass index is 34.49 kg/m.   Constitutional: NAD, calm, comfortable Eyes: PERRL, lids and conjunctivae normal ENMT: Mucous membranes are moist.  Respiratory: clear to auscultation bilaterally, no wheezing, no crackles. Normal respiratory effort. No accessory muscle use.  Cardiovascular: Regular rate and rhythm, no murmurs / rubs / gallops. No extremity edema.  Neurologic: grossly intact and non-focal Psychiatric: Normal judgment and insight. Alert and oriented x 3. Normal mood.    Impression and Plan:  Essential hypertension  -Increase HCTZ to 25 mg. -BP f/u in 6 weeks. -Low sodium diet. -Continue ambulatory BP measurements.  Other ulcerative colitis without complication (HCC) -On mesalamine. -Followed by GI.  RA -On humira, followed by rheum.  Obesity (BMI 30.0-34.9) -Discussed healthy lifestyle, including increased physical activity and better food choices to promote weight loss.   Patient Instructions  -Nice seeing you today!!  -Increase HCTZ to 25 mg daily.  -Schedule follow up in 6 weeks.  -Low salt diet (see below).   DASH Eating Plan DASH stands for "Dietary Approaches to Stop Hypertension." The DASH eating plan is a healthy eating plan that has been shown to reduce high blood pressure (hypertension). It may also reduce your risk for type 2 diabetes, heart disease, and stroke. The DASH eating plan may also help with weight loss. What are tips for following this plan?  General guidelines  Avoid eating more than 2,300 mg (milligrams) of salt (sodium) a day. If you have hypertension, you may need to reduce your sodium intake to  1,500 mg a day.  Limit alcohol intake to no more than 1 drink a day for nonpregnant women and 2 drinks a day for men. One drink equals 12 oz of beer, 5 oz of wine, or 1 oz of hard liquor.  Work with your health care provider to maintain a healthy body weight or to lose weight. Ask what an ideal weight is for you.  Get at least 30 minutes of exercise that causes your heart to beat faster (aerobic exercise) most days of the week. Activities may include walking, swimming, or biking.  Work with your health care provider or diet and nutrition specialist (dietitian) to adjust your eating plan to your individual calorie needs. Reading food labels   Check food labels for the amount of sodium per serving. Choose foods with less than 5 percent of the Daily Value of sodium. Generally, foods with less than 300 mg of sodium per serving fit into this eating plan.  To find whole grains, look for the word "whole" as the first word in the ingredient list. Shopping  Buy products labeled as "low-sodium" or "no salt added."  Buy fresh foods. Avoid canned foods and premade or frozen meals. Cooking  Avoid adding salt when cooking. Use salt-free seasonings or herbs instead  of table salt or sea salt. Check with your health care provider or pharmacist before using salt substitutes.  Do not fry foods. Cook foods using healthy methods such as baking, boiling, grilling, and broiling instead.  Cook with heart-healthy oils, such as olive, canola, soybean, or sunflower oil. Meal planning  Eat a balanced diet that includes: ? 5 or more servings of fruits and vegetables each day. At each meal, try to fill half of your plate with fruits and vegetables. ? Up to 6-8 servings of whole grains each day. ? Less than 6 oz of lean meat, poultry, or fish each day. A 3-oz serving of meat is about the same size as a deck of cards. One egg equals 1 oz. ? 2 servings of low-fat dairy each day. ? A serving of nuts, seeds, or  beans 5 times each week. ? Heart-healthy fats. Healthy fats called Omega-3 fatty acids are found in foods such as flaxseeds and coldwater fish, like sardines, salmon, and mackerel.  Limit how much you eat of the following: ? Canned or prepackaged foods. ? Food that is high in trans fat, such as fried foods. ? Food that is high in saturated fat, such as fatty meat. ? Sweets, desserts, sugary drinks, and other foods with added sugar. ? Full-fat dairy products.  Do not salt foods before eating.  Try to eat at least 2 vegetarian meals each week.  Eat more home-cooked food and less restaurant, buffet, and fast food.  When eating at a restaurant, ask that your food be prepared with less salt or no salt, if possible. What foods are recommended? The items listed may not be a complete list. Talk with your dietitian about what dietary choices are best for you. Grains Whole-grain or whole-wheat bread. Whole-grain or whole-wheat pasta. Brown rice. [place]. Bulgur. Whole-grain and low-sodium cereals. Pita bread. Low-fat, low-sodium crackers. Whole-wheat flour tortillas. Vegetables Fresh or frozen vegetables (raw, steamed, roasted, or grilled). Low-sodium or reduced-sodium tomato and vegetable juice. Low-sodium or reduced-sodium tomato sauce and tomato paste. Low-sodium or reduced-sodium canned vegetables. Fruits All fresh, dried, or frozen fruit. Canned fruit in natural juice (without added sugar). Meat and other protein foods Skinless chicken or [place]. Ground chicken or [place]. Pork with fat trimmed off. Fish and seafood. Egg whites. Dried beans, peas, or lentils. Unsalted nuts, nut butters, and seeds. Unsalted canned beans. Lean cuts of beef with fat trimmed off. Low-sodium, lean deli meat. Dairy Low-fat (1%) or fat-free (skim) milk. Fat-free, low-fat, or reduced-fat cheeses. Nonfat, low-sodium ricotta or cottage cheese. Low-fat or nonfat yogurt. Low-fat, low-sodium cheese. Fats and  oils Soft margarine without trans fats. Vegetable oil. Low-fat, reduced-fat, or light mayonnaise and salad dressings (reduced-sodium). Canola, safflower, olive, soybean, and sunflower oils. Avocado. Seasoning and other foods Herbs. Spices. Seasoning mixes without salt. Unsalted popcorn and pretzels. Fat-free sweets. What foods are not recommended? The items listed may not be a complete list. Talk with your dietitian about what dietary choices are best for you. Grains Baked goods made with fat, such as croissants, muffins, or some breads. Dry pasta or rice meal packs. Vegetables Creamed or fried vegetables. Vegetables in a cheese sauce. Regular canned vegetables (not low-sodium or reduced-sodium). Regular canned tomato sauce and paste (not low-sodium or reduced-sodium). Regular tomato and vegetable juice (not low-sodium or reduced-sodium). Angie Fava. Olives. Fruits Canned fruit in a light or heavy syrup. Fried fruit. Fruit in cream or butter sauce. Meat and other protein foods Fatty cuts of meat. Ribs. Fried meat.  Berniece Salines. Sausage. Bologna and other processed lunch meats. Salami. Fatback. Hotdogs. Bratwurst. Salted nuts and seeds. Canned beans with added salt. Canned or smoked fish. Whole eggs or egg yolks. Chicken or [place] with skin. Dairy Whole or 2% milk, cream, and half-and-half. Whole or full-fat cream cheese. Whole-fat or sweetened yogurt. Full-fat cheese. Nondairy creamers. Whipped toppings. Processed cheese and cheese spreads. Fats and oils Butter. Stick margarine. Lard. Shortening. Ghee. Bacon fat. Tropical oils, such as coconut, palm kernel, or palm oil. Seasoning and other foods Salted popcorn and pretzels. Onion salt, garlic salt, seasoned salt, table salt, and sea salt. Worcestershire sauce. Tartar sauce. Barbecue sauce. Teriyaki sauce. Soy sauce, including reduced-sodium. Steak sauce. Canned and packaged gravies. Fish sauce. Oyster sauce. Cocktail sauce. Horseradish that you find on the  shelf. Ketchup. Mustard. Meat flavorings and tenderizers. Bouillon cubes. Hot sauce and Tabasco sauce. Premade or packaged marinades. Premade or packaged taco seasonings. Relishes. Regular salad dressings. Where to find more information:  National Heart, Lung, and [place]: https://wilson-eaton.com/  American Heart Association: www.heart.org Summary  The DASH eating plan is a healthy eating plan that has been shown to reduce high blood pressure (hypertension). It may also reduce your risk for type 2 diabetes, heart disease, and stroke.  With the DASH eating plan, you should limit salt (sodium) intake to 2,300 mg a day. If you have hypertension, you may need to reduce your sodium intake to 1,500 mg a day.  When on the DASH eating plan, aim to eat more fresh fruits and vegetables, whole grains, lean proteins, low-fat dairy, and heart-healthy fats.  Work with your health care provider or diet and nutrition specialist (dietitian) to adjust your eating plan to your individual calorie needs. This information is not intended to replace advice given to you by your health care provider. Make sure you discuss any questions you have with your health care provider. Document Revised: 01/30/2017 Document Reviewed: 02/11/2016 Elsevier Patient Education  2020 [place], MD [facility]

## 2019-05-25 NOTE — Patient Instructions (Signed)
-Nice seeing you today!!  -Increase HCTZ to 25 mg daily.  -Schedule follow up in 6 weeks.  -Low salt diet (see below).   DASH Eating Plan DASH stands for "Dietary Approaches to Stop Hypertension." The DASH eating plan is a healthy eating plan that has been shown to reduce high blood pressure (hypertension). It may also reduce your risk for type 2 diabetes, heart disease, and stroke. The DASH eating plan may also help with weight loss. What are tips for following this plan?  General guidelines  Avoid eating more than 2,300 mg (milligrams) of salt (sodium) a day. If you have hypertension, you may need to reduce your sodium intake to 1,500 mg a day.  Limit alcohol intake to no more than 1 drink a day for nonpregnant women and 2 drinks a day for men. One drink equals 12 oz of beer, 5 oz of wine, or 1 oz of hard liquor.  Work with your health care provider to maintain a healthy body weight or to lose weight. Ask what an ideal weight is for you.  Get at least 30 minutes of exercise that causes your heart to beat faster (aerobic exercise) most days of the week. Activities may include walking, swimming, or biking.  Work with your health care provider or diet and nutrition specialist (dietitian) to adjust your eating plan to your individual calorie needs. Reading food labels   Check food labels for the amount of sodium per serving. Choose foods with less than 5 percent of the Daily Value of sodium. Generally, foods with less than 300 mg of sodium per serving fit into this eating plan.  To find whole grains, look for the word "whole" as the first word in the ingredient list. Shopping  Buy products labeled as "low-sodium" or "no salt added."  Buy fresh foods. Avoid canned foods and premade or frozen meals. Cooking  Avoid adding salt when cooking. Use salt-free seasonings or herbs instead of table salt or sea salt. Check with your health care provider or pharmacist before using salt  substitutes.  Do not fry foods. Cook foods using healthy methods such as baking, boiling, grilling, and broiling instead.  Cook with heart-healthy oils, such as olive, canola, soybean, or sunflower oil. Meal planning  Eat a balanced diet that includes: ? 5 or more servings of fruits and vegetables each day. At each meal, try to fill half of your plate with fruits and vegetables. ? Up to 6-8 servings of whole grains each day. ? Less than 6 oz of lean meat, poultry, or fish each day. A 3-oz serving of meat is about the same size as a deck of cards. One egg equals 1 oz. ? 2 servings of low-fat dairy each day. ? A serving of nuts, seeds, or beans 5 times each week. ? Heart-healthy fats. Healthy fats called Omega-3 fatty acids are found in foods such as flaxseeds and coldwater fish, like sardines, salmon, and mackerel.  Limit how much you eat of the following: ? Canned or prepackaged foods. ? Food that is high in trans fat, such as fried foods. ? Food that is high in saturated fat, such as fatty meat. ? Sweets, desserts, sugary drinks, and other foods with added sugar. ? Full-fat dairy products.  Do not salt foods before eating.  Try to eat at least 2 vegetarian meals each week.  Eat more home-cooked food and less restaurant, buffet, and fast food.  When eating at a restaurant, ask that your food be prepared  with less salt or no salt, if possible. What foods are recommended? The items listed may not be a complete list. Talk with your dietitian about what dietary choices are best for you. Grains Whole-grain or whole-wheat bread. Whole-grain or whole-wheat pasta. Brown rice. [place]. Bulgur. Whole-grain and low-sodium cereals. Pita bread. Low-fat, low-sodium crackers. Whole-wheat flour tortillas. Vegetables Fresh or frozen vegetables (raw, steamed, roasted, or grilled). Low-sodium or reduced-sodium tomato and vegetable juice. Low-sodium or reduced-sodium tomato sauce and tomato  paste. Low-sodium or reduced-sodium canned vegetables. Fruits All fresh, dried, or frozen fruit. Canned fruit in natural juice (without added sugar). Meat and other protein foods Skinless chicken or [place]. Ground chicken or [place]. Pork with fat trimmed off. Fish and seafood. Egg whites. Dried beans, peas, or lentils. Unsalted nuts, nut butters, and seeds. Unsalted canned beans. Lean cuts of beef with fat trimmed off. Low-sodium, lean deli meat. Dairy Low-fat (1%) or fat-free (skim) milk. Fat-free, low-fat, or reduced-fat cheeses. Nonfat, low-sodium ricotta or cottage cheese. Low-fat or nonfat yogurt. Low-fat, low-sodium cheese. Fats and oils Soft margarine without trans fats. Vegetable oil. Low-fat, reduced-fat, or light mayonnaise and salad dressings (reduced-sodium). Canola, safflower, olive, soybean, and sunflower oils. Avocado. Seasoning and other foods Herbs. Spices. Seasoning mixes without salt. Unsalted popcorn and pretzels. Fat-free sweets. What foods are not recommended? The items listed may not be a complete list. Talk with your dietitian about what dietary choices are best for you. Grains Baked goods made with fat, such as croissants, muffins, or some breads. Dry pasta or rice meal packs. Vegetables Creamed or fried vegetables. Vegetables in a cheese sauce. Regular canned vegetables (not low-sodium or reduced-sodium). Regular canned tomato sauce and paste (not low-sodium or reduced-sodium). Regular tomato and vegetable juice (not low-sodium or reduced-sodium). Angie Fava. Olives. Fruits Canned fruit in a light or heavy syrup. Fried fruit. Fruit in cream or butter sauce. Meat and other protein foods Fatty cuts of meat. Ribs. Fried meat. Berniece Salines. Sausage. Bologna and other processed lunch meats. Salami. Fatback. Hotdogs. Bratwurst. Salted nuts and seeds. Canned beans with added salt. Canned or smoked fish. Whole eggs or egg yolks. Chicken or [place] with skin. Dairy Whole or 2% milk,  cream, and half-and-half. Whole or full-fat cream cheese. Whole-fat or sweetened yogurt. Full-fat cheese. Nondairy creamers. Whipped toppings. Processed cheese and cheese spreads. Fats and oils Butter. Stick margarine. Lard. Shortening. Ghee. Bacon fat. Tropical oils, such as coconut, palm kernel, or palm oil. Seasoning and other foods Salted popcorn and pretzels. Onion salt, garlic salt, seasoned salt, table salt, and sea salt. Worcestershire sauce. Tartar sauce. Barbecue sauce. Teriyaki sauce. Soy sauce, including reduced-sodium. Steak sauce. Canned and packaged gravies. Fish sauce. Oyster sauce. Cocktail sauce. Horseradish that you find on the shelf. Ketchup. Mustard. Meat flavorings and tenderizers. Bouillon cubes. Hot sauce and Tabasco sauce. Premade or packaged marinades. Premade or packaged taco seasonings. Relishes. Regular salad dressings. Where to find more information:  National Heart, Lung, and [place]: https://wilson-eaton.com/  American Heart Association: www.heart.org Summary  The DASH eating plan is a healthy eating plan that has been shown to reduce high blood pressure (hypertension). It may also reduce your risk for type 2 diabetes, heart disease, and stroke.  With the DASH eating plan, you should limit salt (sodium) intake to 2,300 mg a day. If you have hypertension, you may need to reduce your sodium intake to 1,500 mg a day.  When on the DASH eating plan, aim to eat more fresh fruits and vegetables, whole grains, lean  proteins, low-fat dairy, and heart-healthy fats.  Work with your health care provider or diet and nutrition specialist (dietitian) to adjust your eating plan to your individual calorie needs. This information is not intended to replace advice given to you by your health care provider. Make sure you discuss any questions you have with your health care provider. Document Revised: 01/30/2017 Document Reviewed: 02/11/2016 Elsevier Patient Education  2020 [employer].

## 2019-05-27 ENCOUNTER — Encounter: Payer: Self-pay | Admitting: Internal Medicine

## 2019-06-27 ENCOUNTER — Other Ambulatory Visit: Payer: Self-pay | Admitting: Gastroenterology

## 2019-06-29 ENCOUNTER — Other Ambulatory Visit: Payer: Self-pay | Admitting: Gastroenterology

## 2019-07-05 ENCOUNTER — Other Ambulatory Visit: Payer: Self-pay

## 2019-07-06 ENCOUNTER — Encounter: Payer: Self-pay | Admitting: Internal Medicine

## 2019-07-06 ENCOUNTER — Ambulatory Visit (INDEPENDENT_AMBULATORY_CARE_PROVIDER_SITE_OTHER): Payer: BC Managed Care – PPO | Admitting: Internal Medicine

## 2019-07-06 VITALS — BP 120/80 | HR 110 | Temp 97.7°F | Wt 210.8 lb

## 2019-07-06 DIAGNOSIS — I10 Essential (primary) hypertension: Secondary | ICD-10-CM | POA: Diagnosis not present

## 2019-07-06 NOTE — Progress Notes (Signed)
Established Patient Office Visit     This visit occurred during the SARS-CoV-2 public health emergency.  Safety protocols were in place, including screening questions prior to the visit, additional usage of staff PPE, and extensive cleaning of exam room while observing appropriate contact time as indicated for disinfecting solutions.    CC/Reason for Visit: Blood pressure follow-up  HPI: Meagan Grimes is a 44 y.o. female who is coming in today for the above mentioned reasons.  She was recently diagnosed with hypertension.  At last visit hydrochlorothiazide was increased from 12.5 to 25 mg.  Blood pressure has  improved.  She has been feeling well.  She has received both of her Covid vaccines since we last spoke.   Past Medical/Surgical History: Past Medical History:  Diagnosis Date  . Anxiety   . Arthritis   . 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 Crohn's disease ([place])   . Hypertension   . Rheumatoid arthritis [facility])     Past Surgical History:  Procedure Laterality Date  . CESAREAN SECTION    . LASER ABLATION      Social History:  reports that she has quit smoking. She has never used smokeless tobacco. She reports current alcohol use of about 5.0 standard drinks of alcohol per week. She reports that she does not use drugs.  Allergies: No Known Allergies  Family History:  Family History  Problem Relation Age of Onset  . Hypertension Mother   . Hypertension Father   . Mental illness Father   . Lung cancer Maternal Grandmother   . Hypertension Maternal Grandfather   . Heart disease Maternal Grandfather   . Heart disease Paternal Grandmother   . Hypertension Paternal Grandmother   . Lung cancer Maternal Aunt   . Colon cancer Neg Hx   . Esophageal cancer Neg Hx   . Rectal cancer Neg Hx   . Liver cancer Neg Hx   . Stomach cancer Neg Hx      Current Outpatient Medications:  .  HUMIRA PEN 40 MG/0.4ML PNKT, , Disp: , Rfl:  .  hydrochlorothiazide (HYDRODIURIL) 25  MG tablet, Take 1 tablet (25 mg total) by mouth daily., Disp: 90 tablet, Rfl: 1 .  hydrOXYzine (ATARAX/VISTARIL) 50 MG tablet, Take 1 tablet (50 mg total) by mouth 3 (three) times daily as needed for anxiety., Disp: 90 tablet, Rfl: 3 .  mesalamine (LIALDA) 1.2 g EC tablet, TAKE 4 TABLETS BY MOUTH EVERY MORNING, Disp: 360 tablet, Rfl: 0  Current Facility-Administered Medications:  .  0.9 %  sodium chloride infusion, 500 mL, Intravenous, Continuous, Nandigam, Kavitha V, MD  Review of Systems:  Constitutional: Denies fever, chills, diaphoresis, appetite change and fatigue.  HEENT: Denies photophobia, eye pain, redness, hearing loss, ear pain, congestion, sore throat, rhinorrhea, sneezing, mouth sores, trouble swallowing, neck pain, neck stiffness and tinnitus.   Respiratory: Denies SOB, DOE, cough, chest tightness,  and wheezing.   Cardiovascular: Denies chest pain, palpitations and leg swelling.  Gastrointestinal: Denies nausea, vomiting, abdominal pain, diarrhea, constipation, blood in stool and abdominal distention.  Genitourinary: Denies dysuria, urgency, frequency, hematuria, flank pain and difficulty urinating.  Endocrine: Denies: hot or cold intolerance, sweats, changes in hair or nails, polyuria, polydipsia. Musculoskeletal: Denies myalgias, back pain, joint swelling, arthralgias and gait problem.  Skin: Denies pallor, rash and wound.  Neurological: Denies dizziness, seizures, syncope, weakness, light-headedness, numbness and headaches.  Hematological: Denies adenopathy. Easy bruising, personal or family bleeding history  Psychiatric/Behavioral: Denies suicidal ideation, mood changes,  confusion, nervousness, sleep disturbance and agitation    Physical Exam: Vitals:   07/06/19 0825  BP: 120/80  Pulse: (!) 110  Temp: 97.7 F (36.5 C)  TempSrc: Temporal  SpO2: 97%  Weight: 210 lb 12.8 oz (95.6 kg)    Body mass index is 34.02 kg/m.   Constitutional: NAD, calm,  comfortable Eyes: PERRL, lids and conjunctivae normal ENMT: Mucous membranes are moist.  Respiratory: clear to auscultation bilaterally, no wheezing, no crackles. Normal respiratory effort. No accessory muscle use.  Cardiovascular: Regular rate and rhythm, no murmurs / rubs / gallops. No extremity edema. Neurologic: Grossly intact and nonfocal  Psychiatric: Normal judgment and insight. Alert and oriented x 3. Normal mood.    Impression and Plan:  Essential hypertension -Well-controlled, continue hydrochlorothiazide 25 mg daily. -Check renal function when she returns for physical within the next couple weeks.    Lelon Frohlich, MD [facility]

## 2019-07-12 ENCOUNTER — Ambulatory Visit: Payer: BC Managed Care – PPO | Admitting: Physician Assistant

## 2019-07-28 DIAGNOSIS — Z6833 Body mass index (BMI) 33.0-33.9, adult: Secondary | ICD-10-CM | POA: Diagnosis not present

## 2019-07-28 DIAGNOSIS — Z01419 Encounter for gynecological examination (general) (routine) without abnormal findings: Secondary | ICD-10-CM | POA: Diagnosis not present

## 2019-07-28 DIAGNOSIS — Z1231 Encounter for screening mammogram for malignant neoplasm of breast: Secondary | ICD-10-CM | POA: Diagnosis not present

## 2019-08-15 ENCOUNTER — Other Ambulatory Visit: Payer: Self-pay

## 2019-08-16 ENCOUNTER — Other Ambulatory Visit: Payer: Self-pay | Admitting: Internal Medicine

## 2019-08-16 ENCOUNTER — Ambulatory Visit (INDEPENDENT_AMBULATORY_CARE_PROVIDER_SITE_OTHER): Payer: BC Managed Care – PPO | Admitting: Internal Medicine

## 2019-08-16 ENCOUNTER — Encounter: Payer: Self-pay | Admitting: Internal Medicine

## 2019-08-16 ENCOUNTER — Other Ambulatory Visit (INDEPENDENT_AMBULATORY_CARE_PROVIDER_SITE_OTHER): Payer: BC Managed Care – PPO

## 2019-08-16 VITALS — BP 140/90 | HR 91 | Temp 97.9°F | Ht 66.0 in | Wt 212.4 lb

## 2019-08-16 DIAGNOSIS — M069 Rheumatoid arthritis, unspecified: Secondary | ICD-10-CM | POA: Diagnosis not present

## 2019-08-16 DIAGNOSIS — K5180 Other ulcerative colitis without complications: Secondary | ICD-10-CM | POA: Diagnosis not present

## 2019-08-16 DIAGNOSIS — I10 Essential (primary) hypertension: Secondary | ICD-10-CM | POA: Diagnosis not present

## 2019-08-16 DIAGNOSIS — E559 Vitamin D deficiency, unspecified: Secondary | ICD-10-CM

## 2019-08-16 DIAGNOSIS — Z0000 Encounter for general adult medical examination without abnormal findings: Secondary | ICD-10-CM | POA: Diagnosis not present

## 2019-08-16 LAB — CBC WITH DIFFERENTIAL/PLATELET
Basophils Absolute: 0 10*3/uL (ref 0.0–0.1)
Basophils Relative: 0.5 % (ref 0.0–3.0)
Eosinophils Absolute: 0.4 10*3/uL (ref 0.0–0.7)
Eosinophils Relative: 4.6 % (ref 0.0–5.0)
HCT: 38.6 % (ref 36.0–46.0)
Hemoglobin: 13.2 g/dL (ref 12.0–15.0)
Lymphocytes Relative: 22.7 % (ref 12.0–46.0)
Lymphs Abs: 1.8 10*3/uL (ref 0.7–4.0)
MCHC: 34.2 g/dL (ref 30.0–36.0)
MCV: 93.3 fl (ref 78.0–100.0)
Monocytes Absolute: 0.5 10*3/uL (ref 0.1–1.0)
Monocytes Relative: 6.4 % (ref 3.0–12.0)
Neutro Abs: 5.1 10*3/uL (ref 1.4–7.7)
Neutrophils Relative %: 65.8 % (ref 43.0–77.0)
Platelets: 281 10*3/uL (ref 150.0–400.0)
RBC: 4.13 Mil/uL (ref 3.87–5.11)
RDW: 13.7 % (ref 11.5–15.5)
WBC: 7.7 10*3/uL (ref 4.0–10.5)

## 2019-08-16 LAB — COMPREHENSIVE METABOLIC PANEL
ALT: 19 U/L (ref 0–35)
AST: 20 U/L (ref 0–37)
Albumin: 4.1 g/dL (ref 3.5–5.2)
Alkaline Phosphatase: 76 U/L (ref 39–117)
BUN: 12 mg/dL (ref 6–23)
CO2: 27 mEq/L (ref 19–32)
Calcium: 8.9 mg/dL (ref 8.4–10.5)
Chloride: 99 mEq/L (ref 96–112)
Creatinine, Ser: 0.8 mg/dL (ref 0.40–1.20)
GFR: 77.62 mL/min (ref >60.00)
Glucose, Bld: 94 mg/dL (ref 70–99)
Potassium: 3.7 mEq/L (ref 3.5–5.1)
Sodium: 134 mEq/L — ABNORMAL LOW (ref 135–145)
Total Bilirubin: 0.8 mg/dL (ref 0.2–1.2)
Total Protein: 7.9 g/dL (ref 6.0–8.3)

## 2019-08-16 LAB — LIPID PANEL
Cholesterol: 189 mg/dL (ref 0–200)
HDL: 49.5 mg/dL (ref >39.00)
NonHDL: 139.67
Total CHOL/HDL Ratio: 4
Triglycerides: 257 mg/dL — ABNORMAL HIGH (ref 0.0–149.0)
VLDL: 51.4 mg/dL — ABNORMAL HIGH (ref 0.0–40.0)

## 2019-08-16 LAB — LDL CHOLESTEROL, DIRECT: Direct LDL: 99 mg/dL

## 2019-08-16 LAB — VITAMIN D 25 HYDROXY (VIT D DEFICIENCY, FRACTURES): VITD: 25.45 ng/mL — ABNORMAL LOW (ref 30.00–100.00)

## 2019-08-16 LAB — HEMOGLOBIN A1C: Hgb A1c MFr Bld: 5.9 % (ref 4.6–6.5)

## 2019-08-16 LAB — VITAMIN B12: Vitamin B-12: 216 pg/mL (ref 211–911)

## 2019-08-16 LAB — TSH: TSH: 1.53 u[IU]/mL (ref 0.35–4.50)

## 2019-08-16 MED ORDER — VITAMIN D (ERGOCALCIFEROL) 1.25 MG (50000 UNIT) PO CAPS: 50000.0000 [IU] | ORAL_CAPSULE | ORAL | 0 refills | Status: AC

## 2019-08-16 MED ORDER — HYDROCHLOROTHIAZIDE 25 MG PO TABS: 25.0000 mg | ORAL_TABLET | Freq: Every day | ORAL | 1 refills | Status: DC

## 2019-08-16 NOTE — Patient Instructions (Signed)
-Nice seeing you today!!  -Lab work today; will notify you once results are available.  -Remember to schedule your eye and dental exams.  -See you back in 3 months for blood pressure follow up.   Preventive Care 66-45 Years Old, Female Preventive care refers to visits with your health care provider and lifestyle choices that can promote health and wellness. This includes:  A yearly physical exam. This may also be called an annual well check.  Regular dental visits and eye exams.  Immunizations.  Screening for certain conditions.  Healthy lifestyle choices, such as eating a healthy diet, getting regular exercise, not using drugs or products that contain nicotine and tobacco, and limiting alcohol use. What can I expect for my preventive care visit? Physical exam Your health care provider will check your:  Height and weight. This may be used to calculate body mass index (BMI), which tells if you are at a healthy weight.  Heart rate and blood pressure.  Skin for abnormal spots. Counseling Your health care provider may ask you questions about your:  Alcohol, tobacco, and drug use.  Emotional well-being.  Home and relationship well-being.  Sexual activity.  Eating habits.  Work and work Statistician.  Method of birth control.  Menstrual cycle.  Pregnancy history. What immunizations do I need?  Influenza (flu) vaccine  This is recommended every year. Tetanus, diphtheria, and pertussis (Tdap) vaccine  You may need a Td booster every 10 years. Varicella (chickenpox) vaccine  You may need this if you have not been vaccinated. Zoster (shingles) vaccine  You may need this after age 45. Measles, mumps, and rubella (MMR) vaccine  You may need at least one dose of MMR if you were born in 1957 or later. You may also need a second dose. Pneumococcal conjugate (PCV45) vaccine vaccine  You may need this if you have certain conditions and were not previously  vaccinated. Pneumococcal polysaccharide (PPSV23) vaccine  You may need one or two doses if you smoke cigarettes or if you have certain conditions. Meningococcal conjugate (MenACWY) vaccine  You may need this if you have certain conditions. Hepatitis A vaccine  You may need this if you have certain conditions or if you travel or work in places where you may be exposed to hepatitis A. Hepatitis B vaccine  You may need this if you have certain conditions or if you travel or work in places where you may be exposed to hepatitis B. Haemophilus influenzae type b (Hib) vaccine  You may need this if you have certain conditions. Human papillomavirus (HPV) vaccine  If recommended by your health care provider, you may need three doses over 6 months. You may receive vaccines as individual doses or as more than one vaccine together in one shot (combination vaccines). Talk with your health care provider about the risks and benefits of combination vaccines. What tests do I need? Blood tests  Lipid and cholesterol levels. These may be checked every 5 years, or more frequently if you are over 45 years old.  Hepatitis C test.  Hepatitis B test. Screening  Lung cancer screening. You may have this screening every year starting at age 50 if you have a 30-pack-year history of smoking and currently smoke or have quit within the past 15 years.  Colorectal cancer screening. All adults should have this screening starting at age 50 and continuing until age 44. Your health care provider may recommend screening at age 85 if you are at increased risk. You will have tests  every 1-10 years, depending on your results and the type of screening test.  Diabetes screening. This is done by checking your blood sugar (glucose) after you have not eaten for a while (fasting). You may have this done every 1-3 years.  Mammogram. This may be done every 1-2 years. Talk with your health care provider about when you should start  having regular mammograms. This may depend on whether you have a family history of breast cancer.  BRCA-related cancer screening. This may be done if you have a family history of breast, ovarian, tubal, or peritoneal cancers.  Pelvic exam and Pap test. This may be done every 3 years starting at age 45. Starting at age 45, this may be done every 5 years if you have a Pap test in combination with an HPV test. Other tests  Sexually transmitted disease (STD) testing.  Bone density scan. This is done to screen for osteoporosis. You may have this scan if you are at high risk for osteoporosis. Follow these instructions at home: Eating and drinking  Eat a diet that includes fresh fruits and vegetables, whole grains, lean protein, and low-fat dairy.  Take vitamin and mineral supplements as recommended by your health care provider.  Do not drink alcohol if: ? Your health care provider tells you not to drink. ? You are pregnant, may be pregnant, or are planning to become pregnant.  If you drink alcohol: ? Limit how much you have to 0-1 drink a day. ? Be aware of how much alcohol is in your drink. In the [place] one drink equals one 12 oz bottle of beer (355 mL), one 5 oz glass of wine (148 mL), or one 1 oz glass of hard liquor (44 mL). Lifestyle  Take daily care of your teeth and gums.  Stay active. Exercise for at least 30 minutes on 5 or more days each week.  Do not use any products that contain nicotine or tobacco, such as cigarettes, e-cigarettes, and chewing tobacco. If you need help quitting, ask your health care provider.  If you are sexually active, practice safe sex. Use a condom or other form of birth control (contraception) in order to prevent pregnancy and STIs (sexually transmitted infections).  If told by your health care provider, take low-dose aspirin daily starting at age 27. What's next?  Visit your health care provider once a year for a well check visit.  Ask your health  care provider how often you should have your eyes and teeth checked.  Stay up to date on all vaccines. This information is not intended to replace advice given to you by your health care provider. Make sure you discuss any questions you have with your health care provider. Document Revised: 10/29/2017 Document Reviewed: 10/29/2017 Elsevier Patient Education  2020 [employer].

## 2019-08-16 NOTE — Progress Notes (Signed)
Established Patient Office Visit     This visit occurred during the SARS-CoV-2 public health emergency.  Safety protocols were in place, including screening questions prior to the visit, additional usage of staff PPE, and extensive cleaning of exam room while observing appropriate contact time as indicated for disinfecting solutions.    CC/Reason for Visit: Annual preventive exam  HPI: Meagan Grimes is a 44 y.o. female who is coming in today for the above mentioned reasons. Past Medical History is significant for: Recently diagnosed hypertension, ulcerative colitis and rheumatoid arthritis.  She is on mesalamine and Humira and follows with GI and rheumatology.  She has no complaints today.  She has completed both of her Covid vaccines.  She does not have routine eye and dental care.  She saw GYN 2 weeks ago and had her mammogram.  She has an appointment with GI coming up for colonoscopies.   Past Medical/Surgical History: Past Medical History:  Diagnosis Date  . Anxiety   . Arthritis   . 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 Crohn's disease ([place])   . Hypertension   . Rheumatoid arthritis [facility])     Past Surgical History:  Procedure Laterality Date  . CESAREAN SECTION    . LASER ABLATION      Social History:  reports that she has quit smoking. She has never used smokeless tobacco. She reports current alcohol use of about 5.0 standard drinks of alcohol per week. She reports that she does not use drugs.  Allergies: No Known Allergies  Family History:  Family History  Problem Relation Age of Onset  . Hypertension Mother   . Hypertension Father   . Mental illness Father   . Lung cancer Maternal Grandmother   . Hypertension Maternal Grandfather   . Heart disease Maternal Grandfather   . Heart disease Paternal Grandmother   . Hypertension Paternal Grandmother   . Lung cancer Maternal Aunt   . Colon cancer Neg Hx   . Esophageal cancer Neg Hx   . Rectal cancer Neg Hx   .  Liver cancer Neg Hx   . Stomach cancer Neg Hx      Current Outpatient Medications:  .  HUMIRA PEN 40 MG/0.4ML PNKT, , Disp: , Rfl:  .  hydrochlorothiazide (HYDRODIURIL) 25 MG tablet, Take 1 tablet (25 mg total) by mouth daily., Disp: 90 tablet, Rfl: 1 .  hydrOXYzine (ATARAX/VISTARIL) 50 MG tablet, Take 1 tablet (50 mg total) by mouth 3 (three) times daily as needed for anxiety., Disp: 90 tablet, Rfl: 3 .  mesalamine (LIALDA) 1.2 g EC tablet, TAKE 4 TABLETS BY MOUTH EVERY MORNING, Disp: 360 tablet, Rfl: 0  Current Facility-Administered Medications:  .  0.9 %  sodium chloride infusion, 500 mL, Intravenous, Continuous, Nandigam, Kavitha V, MD  Review of Systems:  Constitutional: Denies fever, chills, diaphoresis, appetite change and fatigue.  HEENT: Denies photophobia, eye pain, redness, hearing loss, ear pain, congestion, sore throat, rhinorrhea, sneezing, mouth sores, trouble swallowing, neck pain, neck stiffness and tinnitus.   Respiratory: Denies SOB, DOE, cough, chest tightness,  and wheezing.   Cardiovascular: Denies chest pain, palpitations and leg swelling.  Gastrointestinal: Denies nausea, vomiting, abdominal pain, diarrhea, constipation, blood in stool and abdominal distention.  Genitourinary: Denies dysuria, urgency, frequency, hematuria, flank pain and difficulty urinating.  Endocrine: Denies: hot or cold intolerance, sweats, changes in hair or nails, polyuria, polydipsia. Musculoskeletal: Denies myalgias, back pain, joint swelling, arthralgias and gait problem.  Skin: Denies pallor, rash and  wound.  Neurological: Denies dizziness, seizures, syncope, weakness, light-headedness, numbness and headaches.  Hematological: Denies adenopathy. Easy bruising, personal or family bleeding history  Psychiatric/Behavioral: Denies suicidal ideation, mood changes, confusion, nervousness, sleep disturbance and agitation    Physical Exam: Vitals:   08/16/19 0905  BP: 140/90  Pulse: 91    Temp: 97.9 F (36.6 C)  TempSrc: Temporal  SpO2: 98%  Weight: 212 lb 6.4 oz (96.3 kg)  Height: 5' 6"  (1.676 m)    Body mass index is 34.28 kg/m.   Constitutional: NAD, calm, comfortable Eyes: PERRL, lids and conjunctivae normal ENMT: Mucous membranes are moist.  Tympanic membrane is pearly white, no erythema or bulging. Neck: normal, supple, no masses, no thyromegaly Respiratory: clear to auscultation bilaterally, no wheezing, no crackles. Normal respiratory effort. No accessory muscle use.  Cardiovascular: Regular rate and rhythm, no murmurs / rubs / gallops. No extremity edema.  Abdomen: no tenderness, no masses palpated. No hepatosplenomegaly. Bowel sounds positive.  Musculoskeletal: no clubbing / cyanosis. No joint deformity upper and lower extremities. Good ROM, no contractures. Normal muscle tone.  Skin: no rashes, lesions, ulcers. No induration Neurologic: CN 2-12 grossly intact. Sensation intact, DTR normal. Strength 5/5 in all 4.  Psychiatric: Normal judgment and insight. Alert and oriented x 3. Normal mood.    Impression and Plan:  Encounter for preventive health examination -I have advised routine eye and dental care. -Immunizations are up-to-date including Covid x2. -Screening labs today. -Healthy lifestyle discussed in detail. -She has been having colonoscopies for years given her history of ulcerative colitis, has in fact a GI appointment scheduled for tomorrow. -She had appointment with her GYN 2 weeks ago and mammogram was done at that time, I do not have that report, will obtain.  Pap smear is due in 2021.  Essential hypertension -Blood pressure remains above target today at 140/90, she states she has been having a lot of anxiety as her mother-in-law was just diagnosed with Lewy body dementia. -She will continue ambulatory blood pressure monitoring and return in 3 months for follow-up.  Blood pressure in office last visit was normal.  Other ulcerative colitis  without complication (H[facility]) -Continue mesalamine, followed by GI.  Rheumatoid arthritis, involving unspecified site, unspecified whether rheumatoid factor present ([place]) -Continue Humira, followed by rheumatology.    Patient Instructions  -Nice seeing you today!!  -Lab work today; will notify you once results are available.  -Remember to schedule your eye and dental exams.  -See you back in 3 months for blood pressure follow up.   Preventive Care 80-18 Years Old, Female Preventive care refers to visits with your health care provider and lifestyle choices that can promote health and wellness. This includes:  A yearly physical exam. This may also be called an annual well check.  Regular dental visits and eye exams.  Immunizations.  Screening for certain conditions.  Healthy lifestyle choices, such as eating a healthy diet, getting regular exercise, not using drugs or products that contain nicotine and tobacco, and limiting alcohol use. What can I expect for my preventive care visit? Physical exam Your health care provider will check your:  Height and weight. This may be used to calculate body mass index (BMI), which tells if you are at a healthy weight.  Heart rate and blood pressure.  Skin for abnormal spots. Counseling Your health care provider may ask you questions about your:  Alcohol, tobacco, and drug use.  Emotional well-being.  Home and relationship well-being.  Sexual activity.  Eating  habits.  Work and work Statistician.  Method of birth control.  Menstrual cycle.  Pregnancy history. What immunizations do I need?  Influenza (flu) vaccine  This is recommended every year. Tetanus, diphtheria, and pertussis (Tdap) vaccine  You may need a Td booster every 10 years. Varicella (chickenpox) vaccine  You may need this if you have not been vaccinated. Zoster (shingles) vaccine  You may need this after age 59. Measles, mumps, and rubella (MMR)  vaccine  You may need at least one dose of MMR if you were born in 1957 or later. You may also need a second dose. Pneumococcal conjugate (PCV13) vaccine  You may need this if you have certain conditions and were not previously vaccinated. Pneumococcal polysaccharide (PPSV23) vaccine  You may need one or two doses if you smoke cigarettes or if you have certain conditions. Meningococcal conjugate (MenACWY) vaccine  You may need this if you have certain conditions. Hepatitis A vaccine  You may need this if you have certain conditions or if you travel or work in places where you may be exposed to hepatitis A. Hepatitis B vaccine  You may need this if you have certain conditions or if you travel or work in places where you may be exposed to hepatitis B. Haemophilus influenzae type b (Hib) vaccine  You may need this if you have certain conditions. Human papillomavirus (HPV) vaccine  If recommended by your health care provider, you may need three doses over 6 months. You may receive vaccines as individual doses or as more than one vaccine together in one shot (combination vaccines). Talk with your health care provider about the risks and benefits of combination vaccines. What tests do I need? Blood tests  Lipid and cholesterol levels. These may be checked every 5 years, or more frequently if you are over 51 years old.  Hepatitis C test.  Hepatitis B test. Screening  Lung cancer screening. You may have this screening every year starting at age 26 if you have a 30-pack-year history of smoking and currently smoke or have quit within the past 15 years.  Colorectal cancer screening. All adults should have this screening starting at age 24 and continuing until age 39. Your health care provider may recommend screening at age 4 if you are at increased risk. You will have tests every 1-10 years, depending on your results and the type of screening test.  Diabetes screening. This is done by  checking your blood sugar (glucose) after you have not eaten for a while (fasting). You may have this done every 1-3 years.  Mammogram. This may be done every 1-2 years. Talk with your health care provider about when you should start having regular mammograms. This may depend on whether you have a family history of breast cancer.  BRCA-related cancer screening. This may be done if you have a family history of breast, ovarian, tubal, or peritoneal cancers.  Pelvic exam and Pap test. This may be done every 3 years starting at age 33. Starting at age 43, this may be done every 5 years if you have a Pap test in combination with an HPV test. Other tests  Sexually transmitted disease (STD) testing.  Bone density scan. This is done to screen for osteoporosis. You may have this scan if you are at high risk for osteoporosis. Follow these instructions at home: Eating and drinking  Eat a diet that includes fresh fruits and vegetables, whole grains, lean protein, and low-fat dairy.  Take vitamin and mineral  supplements as recommended by your health care provider.  Do not drink alcohol if: ? Your health care provider tells you not to drink. ? You are pregnant, may be pregnant, or are planning to become pregnant.  If you drink alcohol: ? Limit how much you have to 0-1 drink a day. ? Be aware of how much alcohol is in your drink. In the [place] one drink equals one 12 oz bottle of beer (355 mL), one 5 oz glass of wine (148 mL), or one 1 oz glass of hard liquor (44 mL). Lifestyle  Take daily care of your teeth and gums.  Stay active. Exercise for at least 30 minutes on 5 or more days each week.  Do not use any products that contain nicotine or tobacco, such as cigarettes, e-cigarettes, and chewing tobacco. If you need help quitting, ask your health care provider.  If you are sexually active, practice safe sex. Use a condom or other form of birth control (contraception) in order to prevent pregnancy  and STIs (sexually transmitted infections).  If told by your health care provider, take low-dose aspirin daily starting at age 59. What's next?  Visit your health care provider once a year for a well check visit.  Ask your health care provider how often you should have your eyes and teeth checked.  Stay up to date on all vaccines. This information is not intended to replace advice given to you by your health care provider. Make sure you discuss any questions you have with your health care provider. Document Revised: 10/29/2017 Document Reviewed: 10/29/2017 Elsevier Patient Education  2020 [place], MD [facility]

## 2019-08-18 ENCOUNTER — Other Ambulatory Visit: Payer: Self-pay | Admitting: Internal Medicine

## 2019-08-18 DIAGNOSIS — E559 Vitamin D deficiency, unspecified: Secondary | ICD-10-CM

## 2019-08-22 ENCOUNTER — Other Ambulatory Visit: Payer: Self-pay | Admitting: Gastroenterology

## 2019-09-12 ENCOUNTER — Ambulatory Visit: Payer: BC Managed Care – PPO | Admitting: Family Medicine

## 2019-09-19 ENCOUNTER — Telehealth: Payer: Self-pay | Admitting: Internal Medicine

## 2019-09-19 DIAGNOSIS — K51811 Other ulcerative colitis with rectal bleeding: Secondary | ICD-10-CM | POA: Diagnosis not present

## 2019-09-19 DIAGNOSIS — L4059 Other psoriatic arthropathy: Secondary | ICD-10-CM | POA: Diagnosis not present

## 2019-09-19 DIAGNOSIS — L409 Psoriasis, unspecified: Secondary | ICD-10-CM | POA: Diagnosis not present

## 2019-09-19 DIAGNOSIS — R5382 Chronic fatigue, unspecified: Secondary | ICD-10-CM | POA: Diagnosis not present

## 2019-09-19 NOTE — Telephone Encounter (Signed)
Brandy with [facility], is requesting pt's last lab results. Please fax to [phone]

## 2019-09-20 ENCOUNTER — Other Ambulatory Visit (INDEPENDENT_AMBULATORY_CARE_PROVIDER_SITE_OTHER): Payer: BC Managed Care – PPO

## 2019-09-20 ENCOUNTER — Ambulatory Visit: Payer: BC Managed Care – PPO | Admitting: Physician Assistant

## 2019-09-20 ENCOUNTER — Telehealth: Payer: Self-pay | Admitting: Physician Assistant

## 2019-09-20 ENCOUNTER — Encounter: Payer: Self-pay | Admitting: Physician Assistant

## 2019-09-20 VITALS — BP 148/88 | HR 80 | Ht 66.5 in | Wt 212.0 lb

## 2019-09-20 DIAGNOSIS — K51919 Ulcerative colitis, unspecified with unspecified complications: Secondary | ICD-10-CM

## 2019-09-20 DIAGNOSIS — R1011 Right upper quadrant pain: Secondary | ICD-10-CM | POA: Diagnosis not present

## 2019-09-20 DIAGNOSIS — R198 Other specified symptoms and signs involving the digestive system and abdomen: Secondary | ICD-10-CM | POA: Diagnosis not present

## 2019-09-20 LAB — COMPREHENSIVE METABOLIC PANEL
ALT: 18 U/L (ref 0–35)
AST: 16 U/L (ref 0–37)
Albumin: 4.1 g/dL (ref 3.5–5.2)
Alkaline Phosphatase: 82 U/L (ref 39–117)
BUN: 10 mg/dL (ref 6–23)
CO2: 34 mEq/L — ABNORMAL HIGH (ref 19–32)
Calcium: 9.6 mg/dL (ref 8.4–10.5)
Chloride: 92 mEq/L — ABNORMAL LOW (ref 96–112)
Creatinine, Ser: 0.87 mg/dL (ref 0.40–1.20)
GFR: 70.43 mL/min (ref >60.00)
Glucose, Bld: 94 mg/dL (ref 70–99)
Potassium: 3 mEq/L — ABNORMAL LOW (ref 3.5–5.1)
Sodium: 134 mEq/L — ABNORMAL LOW (ref 135–145)
Total Bilirubin: 0.5 mg/dL (ref 0.2–1.2)
Total Protein: 7.9 g/dL (ref 6.0–8.3)

## 2019-09-20 LAB — CBC WITH DIFFERENTIAL/PLATELET
Basophils Absolute: 0 10*3/uL (ref 0.0–0.1)
Basophils Relative: 0.4 % (ref 0.0–3.0)
Eosinophils Absolute: 0.3 10*3/uL (ref 0.0–0.7)
Eosinophils Relative: 4.4 % (ref 0.0–5.0)
HCT: 38.5 % (ref 36.0–46.0)
Hemoglobin: 13.1 g/dL (ref 12.0–15.0)
Lymphocytes Relative: 30.6 % (ref 12.0–46.0)
Lymphs Abs: 2.4 10*3/uL (ref 0.7–4.0)
MCHC: 34 g/dL (ref 30.0–36.0)
MCV: 93.6 fl (ref 78.0–100.0)
Monocytes Absolute: 0.8 10*3/uL (ref 0.1–1.0)
Monocytes Relative: 10.5 % (ref 3.0–12.0)
Neutro Abs: 4.2 10*3/uL (ref 1.4–7.7)
Neutrophils Relative %: 54.1 % (ref 43.0–77.0)
Platelets: 371 10*3/uL (ref 150.0–400.0)
RBC: 4.11 Mil/uL (ref 3.87–5.11)
RDW: 13.7 % (ref 11.5–15.5)
WBC: 7.7 10*3/uL (ref 4.0–10.5)

## 2019-09-20 LAB — C-REACTIVE PROTEIN: CRP: 4.7 mg/dL (ref 0.5–20.0)

## 2019-09-20 LAB — SEDIMENTATION RATE: Sed Rate: 36 mm/hr — ABNORMAL HIGH (ref 0–20)

## 2019-09-20 MED ORDER — PREDNISONE 10 MG PO TABS
ORAL_TABLET | ORAL | 0 refills | Status: DC
Start: 2019-09-20 — End: 2020-11-01

## 2019-09-20 NOTE — Progress Notes (Signed)
Chief Complaint: Follow-up UC  HPI:    Mrs. Artist is a 44 year old female with a past medical history as listed below including ulcerative colitis, known to Dr. Silverio Decamp, who returns to clinic today for follow-up of her ulcerative colitis.    10/21/2016 colonoscopy with diffuse colitis in the left side of the colon, biopsy showed moderate inflammation consistent with ulcerative colitis.    02/17/2018 patient seen in clinic by Dr. Silverio Decamp.  At that time described being on antibiotics recently for sinus infection and had diarrhea for a while.  She continues to describe some intermittent diarrhea with nocturnal bowel movements and bright red blood per rectum.  Prior to starting that [place] she had rectal bleeding and mucus.  At that visit patient was continued on Lialda 4.8 g daily.  C. difficile was checked and she was given nitroglycerin for a posterior anal fissure.  Colonoscopy was recommended in 6 to 8-week to assess endoscopic healing and degree of inflammation.    Today, patient presents to clinic and explains that she was doing okay and everything was steady until about 2 weeks ago when she started with an increase in diarrheal stools noting sometimes 8-10 a day as well as some abdominal pain which she notes mostly in her right upper quadrant.  She tells me this pain was intermittent but now it is mostly constant, starts an hour after eating something in the morning and really does not go away, rated as an 8-9/10.  Has also noticed some bright red blood mixed in with her bowel movements here and there, though "this part is not consistent".  Currently, she is still on her Lialda 4.8 g daily.    Denies fever or chills.  Past Medical History:  Diagnosis Date  . Anxiety   . Arthritis   . 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 Crohn's disease ([place])   . Hypertension   . Rheumatoid arthritis [facility])     Past Surgical History:  Procedure Laterality Date  . CESAREAN SECTION    . LASER ABLATION       Current Outpatient Medications  Medication Sig Dispense Refill  . HUMIRA PEN 40 MG/0.4ML PNKT     . hydrochlorothiazide (HYDRODIURIL) 25 MG tablet Take 1 tablet (25 mg total) by mouth daily. 90 tablet 1  . hydrOXYzine (ATARAX/VISTARIL) 50 MG tablet Take 1 tablet (50 mg total) by mouth 3 (three) times daily as needed for anxiety. 90 tablet 3  . mesalamine (LIALDA) 1.2 g EC tablet TAKE 4 TABLETS BY MOUTH EVERY MORNING 360 tablet 0  . Vitamin D, Ergocalciferol, (DRISDOL) 1.25 MG (50000 UNIT) CAPS capsule Take 1 capsule (50,000 Units total) by mouth every 7 (seven) days for 12 doses. 12 capsule 0   Current Facility-Administered Medications  Medication Dose Route Frequency Provider Last Rate Last Admin  . 0.9 %  sodium chloride infusion  500 mL Intravenous Continuous Mauri Pole, MD        Allergies as of 09/20/2019  . (No Known Allergies)    Family History  Problem Relation Age of Onset  . Hypertension Mother   . Hypertension Father   . Mental illness Father   . Lung cancer Maternal Grandmother   . Hypertension Maternal Grandfather   . Heart disease Maternal Grandfather   . Heart disease Paternal Grandmother   . Hypertension Paternal Grandmother   . Lung cancer Maternal Aunt   . Colon cancer Neg Hx   . Esophageal cancer Neg Hx   . Rectal  cancer Neg Hx   . Liver cancer Neg Hx   . Stomach cancer Neg Hx     Social History   Socioeconomic History  . Marital status: Married    Spouse name: Not on file  . Number of children: 3  . Years of education: Not on file  . Highest education level: Not on file  Occupational History  . Occupation: IT trainer  Tobacco Use  . Smoking status: Former Research scientist (life sciences)  . Smokeless tobacco: Never Used  Vaping Use  . Vaping Use: Never used  Substance and Sexual Activity  . Alcohol use: Yes    Alcohol/week: 5.0 standard drinks    Types: 5 Glasses of wine per week  . Drug use: No  . Sexual activity: Yes    Partners: Male    Birth  control/protection: None  Other Topics Concern  . Not on file  Social History Narrative  . Not on file   Social Determinants of Health   Financial Resource Strain:   . Difficulty of Paying Living Expenses:   Food Insecurity:   . Worried About Charity fundraiser in the Last Year:   . Arboriculturist in the Last Year:   Transportation Needs:   . Film/video editor (Medical):   Marland Kitchen Lack of Transportation (Non-Medical):   Physical Activity:   . Days of Exercise per Week:   . Minutes of Exercise per Session:   Stress:   . Feeling of Stress :   Social Connections:   . Frequency of Communication with Friends and Family:   . Frequency of Social Gatherings with Friends and Family:   . Attends Religious Services:   . Active Member of Clubs or Organizations:   . Attends Archivist Meetings:   Marland Kitchen Marital Status:   Intimate Partner Violence:   . Fear of Current or Ex-Partner:   . Emotionally Abused:   Marland Kitchen Physically Abused:   . Sexually Abused:     Review of Systems:    Constitutional: No weight loss, fever or chills Cardiovascular: No chest pain   Respiratory: No SOB  Gastrointestinal: See HPI and otherwise negative   Physical Exam:  Vital signs: BP (!) 148/88 (BP Location: Left Arm, Patient Position: Sitting, Cuff Size: Normal)   Pulse 80   Ht 5' 6.5" (1.689 m) Comment: height measured without shoes  Wt 212 lb (96.2 kg)   BMI 33.71 kg/m   Constitutional:   Pleasant Caucasian female appears to be in NAD, Well developed, Well nourished, alert and cooperative Respiratory: Respirations even and unlabored. Lungs clear to auscultation bilaterally.   No wheezes, crackles, or rhonchi.  Cardiovascular: Normal S1, S2. No MRG. Regular rate and rhythm. No peripheral edema, cyanosis or pallor.  Gastrointestinal:  Soft, nondistended, marked ttp in RUQ, firmness felt to palpation which was very tender. No rebound or guarding. Normal bowel sounds. +hepatomegaly Rectal:  Not  performed.  Psychiatric:  Demonstrates good judgement and reason without abnormal affect or behaviors.  RELEVANT LABS AND IMAGING: CBC    Component Value Date/Time   WBC 7.7 08/16/2019 0953   RBC 4.13 08/16/2019 0953   HGB 13.2 08/16/2019 0953   HCT 38.6 08/16/2019 0953   PLT 281.0 08/16/2019 0953   MCV 93.3 08/16/2019 0953   MCH 25.2 (L) 02/22/2010 0531   MCHC 34.2 08/16/2019 0953   RDW 13.7 08/16/2019 0953   LYMPHSABS 1.8 08/16/2019 0953   MONOABS 0.5 08/16/2019 0953   EOSABS 0.4 08/16/2019 2595  BASOSABS 0.0 08/16/2019 0953    CMP     Component Value Date/Time   NA 134 (L) 08/16/2019 0953   K 3.7 08/16/2019 0953   CL 99 08/16/2019 0953   CO2 27 08/16/2019 0953   GLUCOSE 94 08/16/2019 0953   BUN 12 08/16/2019 0953   CREATININE 0.80 08/16/2019 0953   CALCIUM 8.9 08/16/2019 0953   PROT 7.9 08/16/2019 0953   ALBUMIN 4.1 08/16/2019 0953   AST 20 08/16/2019 0953   ALT 19 08/16/2019 0953   ALKPHOS 76 08/16/2019 0953   BILITOT 0.8 08/16/2019 0953   GFRNONAA >60 02/22/2010 0531   GFRAA  02/22/2010 0531    >60        The eGFR has been calculated using the MDRD equation. This calculation has not been validated in all clinical situations. eGFR's persistently <60 mL/min signify possible Chronic Kidney Disease.    Assessment: 1.  Ulcerative colitis with a flare: Describes increase in diarrhea, abdominal pain and blood in her stool over the past 2 weeks 2.  Right upper quadrant abdominal pain: Firmness felt at time of exam today, suspected hepatomegaly, some of the pain could also be from her ulcerative colitis flare, CT ordered to confirm (no previous abdominal imaging to compare)  Plan: 1.  Discussed with patient that it is about time for her to have a repeat colonoscopy as she has not had one in 3 years.  Would recommend that we treat current suspected flare of ulcerative colitis first and see her in follow-up and then schedule. 2.  Start the patient on a Prednisone  taper 40 mg daily x1 week, then decrease by 5 mg/week until finished. 3.  Ordered a repeat CBC, CMP, CRP and ESR today. 4.  Also ordered a CT of the abdomen pelvis for further evaluation of this right upper quadrant pain and firmness felt on exam which is thought to possibly be hepatomegaly.  5.  Patient to follow-up with me in clinic in 6 weeks or sooner if necessary.  She will let us know how she is doing.  This could also change based on results from testing above.  Ellouise Newer, PA-C [facility] 09/20/2019, 11:27 AM  Cc: Isaac Bliss, [place]*

## 2019-09-20 NOTE — Telephone Encounter (Signed)
Meagan Grimes from [facility] is requesting physical directions on how the pt should take the prednisone

## 2019-09-20 NOTE — Patient Instructions (Addendum)
If you are age 45 or older, your body mass index should be between 23-30. Your Body mass index is 33.71 kg/m. If this is out of the aforementioned range listed, please consider follow up with your Primary Care Provider.  If you are age 4 or younger, your body mass index should be between 19-25. Your Body mass index is 33.71 kg/m. If this is out of the aformentioned range listed, please consider follow up with your Primary Care Provider.   Your provider has requested that you go to the basement level for lab work before leaving today. Press "B" on the elevator. The lab is located at the first door on the left as you exit the elevator.  Start Prednisone taper. Prednisone 40 mg daily for 7 days, then 35 mg daily for 7 days, then 30 mg daily for 7 days, 25 mg daily for 7 days, then 20 mg daily for 7 days, then 15 mg daily for 7 days, then 10 mg daily for 7 days, then 5 mg daily for 7 days.  You have been scheduled for a CT scan of the abdomen and pelvis at [facility] ([address]---this is in the same building as [employer]).   You are scheduled on Thursday 09/29/19 at 1pm. You should arrive 15 minutes prior to your appointment time for registration. Please follow the written instructions below on the day of your exam:  WARNING: IF YOU ARE ALLERGIC TO IODINE/X-RAY DYE, PLEASE NOTIFY RADIOLOGY IMMEDIATELY AT [phone]! YOU WILL BE GIVEN A 13 HOUR PREMEDICATION PREP.  1) Do not eat or drink anything after 9am (4 hours prior to your test) 2) You have been given 2 bottles of oral contrast to drink. The solution may taste better if refrigerated, but do NOT add ice or any other liquid to this solution. Shake well before drinking.    Drink 1 bottle of contrast @ 11am (2 hours prior to your exam)  Drink 1 bottle of contrast @ 12pm (1 hour prior to your exam)  You may take any medications as prescribed with a small amount of water, if necessary. If you take any of the  following medications: METFORMIN, GLUCOPHAGE, GLUCOVANCE, AVANDAMET, RIOMET, FORTAMET, [place] MET, JANUMET, GLUMETZA or METAGLIP, you MAY be asked to HOLD this medication 48 hours AFTER the exam.  The purpose of you drinking the oral contrast is to aid in the visualization of your intestinal tract. The contrast solution may cause some diarrhea. Depending on your individual set of symptoms, you may also receive an intravenous injection of x-ray contrast/dye. Plan on being at [facility] for 30 minutes or longer, depending on the type of exam you are having performed.  This test typically takes 30-45 minutes to complete.  If you have any questions regarding your exam or if you need to reschedule, you may call the CT department at [phone] between the hours of 8:00 am and 5:00 pm, Monday-Friday.  ________________________________________________________________________

## 2019-09-20 NOTE — Telephone Encounter (Signed)
Spoke with pharmacy and explained the directions are to long. Pharmacy understood and will fill script.

## 2019-09-21 ENCOUNTER — Other Ambulatory Visit: Payer: Self-pay

## 2019-09-21 DIAGNOSIS — Z8639 Personal history of other endocrine, nutritional and metabolic disease: Secondary | ICD-10-CM

## 2019-09-21 MED ORDER — POTASSIUM CHLORIDE CRYS ER 20 MEQ PO TBCR: 20.0000 meq | EXTENDED_RELEASE_TABLET | Freq: Every day | ORAL | 0 refills | Status: DC

## 2019-09-29 ENCOUNTER — Other Ambulatory Visit: Payer: Self-pay

## 2019-09-29 ENCOUNTER — Ambulatory Visit (INDEPENDENT_AMBULATORY_CARE_PROVIDER_SITE_OTHER)
Admission: RE | Admit: 2019-09-29 | Discharge: 2019-09-29 | Disposition: A | Discharge status: Home / Self Care | Payer: BC Managed Care – PPO | Source: Ambulatory Visit | Attending: Physician Assistant | Admitting: Physician Assistant

## 2019-09-29 DIAGNOSIS — R1011 Right upper quadrant pain: Secondary | ICD-10-CM

## 2019-09-29 DIAGNOSIS — K51919 Ulcerative colitis, unspecified with unspecified complications: Secondary | ICD-10-CM | POA: Diagnosis not present

## 2019-09-29 MED ORDER — IOHEXOL 300 MG/ML  SOLN
100.0000 mL | Freq: Once | INTRAMUSCULAR | Status: AC | PRN
  Administered 2019-09-29: 100 mL via INTRAVENOUS

## 2019-10-04 NOTE — Progress Notes (Signed)
Reviewed and agree with documentation and assessment and plan. K. Veena Azarria Balint , MD   

## 2019-10-05 DIAGNOSIS — Z20822 Contact with and (suspected) exposure to covid-19: Secondary | ICD-10-CM | POA: Diagnosis not present

## 2019-10-05 DIAGNOSIS — Z03818 Encounter for observation for suspected exposure to other biological agents ruled out: Secondary | ICD-10-CM | POA: Diagnosis not present

## 2019-11-28 ENCOUNTER — Other Ambulatory Visit: Payer: Self-pay | Admitting: Gastroenterology

## 2020-03-20 ENCOUNTER — Ambulatory Visit: Payer: BC Managed Care – PPO | Admitting: Nurse Practitioner

## 2020-04-03 ENCOUNTER — Other Ambulatory Visit: Payer: Self-pay | Admitting: Gastroenterology

## 2020-07-04 ENCOUNTER — Other Ambulatory Visit: Payer: Self-pay | Admitting: Gastroenterology

## 2020-08-15 ENCOUNTER — Other Ambulatory Visit: Payer: Self-pay | Admitting: Internal Medicine

## 2020-08-15 DIAGNOSIS — I10 Essential (primary) hypertension: Secondary | ICD-10-CM

## 2020-09-15 ENCOUNTER — Other Ambulatory Visit: Payer: Self-pay | Admitting: Internal Medicine

## 2020-09-15 DIAGNOSIS — I10 Essential (primary) hypertension: Secondary | ICD-10-CM

## 2020-09-21 ENCOUNTER — Other Ambulatory Visit: Payer: Self-pay | Admitting: Internal Medicine

## 2020-09-21 DIAGNOSIS — I10 Essential (primary) hypertension: Secondary | ICD-10-CM

## 2020-10-04 ENCOUNTER — Telehealth: Payer: Self-pay

## 2020-10-04 ENCOUNTER — Other Ambulatory Visit: Payer: Self-pay | Admitting: Gastroenterology

## 2020-10-04 NOTE — Telephone Encounter (Signed)
Appointment scheduled via my chart for 11-01-20 at 11am.

## 2020-10-04 NOTE — Telephone Encounter (Signed)
Patient requesting a refill for Lialda. Noticed her 6 week follow which is due end of August, has not been scheduled. Called the patient. No answer. Left her a message to call soon or send a message through My Chart so she can be scheduled for follow up with [facility] or Dr Silverio Decamp if available.

## 2020-11-01 ENCOUNTER — Encounter: Payer: Self-pay | Admitting: Physician Assistant

## 2020-11-01 ENCOUNTER — Other Ambulatory Visit (INDEPENDENT_AMBULATORY_CARE_PROVIDER_SITE_OTHER): Payer: 59

## 2020-11-01 ENCOUNTER — Ambulatory Visit: Payer: 59 | Admitting: Physician Assistant

## 2020-11-01 VITALS — BP 130/80 | HR 96 | Ht 66.5 in | Wt 216.4 lb

## 2020-11-01 DIAGNOSIS — K51919 Ulcerative colitis, unspecified with unspecified complications: Secondary | ICD-10-CM

## 2020-11-01 LAB — COMPREHENSIVE METABOLIC PANEL
ALT: 9 U/L (ref 0–35)
AST: 10 U/L (ref 0–37)
Albumin: 3.7 g/dL (ref 3.5–5.2)
Alkaline Phosphatase: 72 U/L (ref 39–117)
BUN: 8 mg/dL (ref 6–23)
CO2: 33 mEq/L — ABNORMAL HIGH (ref 19–32)
Calcium: 9.4 mg/dL (ref 8.4–10.5)
Chloride: 93 mEq/L — ABNORMAL LOW (ref 96–112)
Creatinine, Ser: 0.94 mg/dL (ref 0.40–1.20)
GFR: 73 mL/min (ref >60.00)
Glucose, Bld: 122 mg/dL — ABNORMAL HIGH (ref 70–99)
Potassium: 3.5 mEq/L (ref 3.5–5.1)
Sodium: 136 mEq/L (ref 135–145)
Total Bilirubin: 0.3 mg/dL (ref 0.2–1.2)
Total Protein: 7.8 g/dL (ref 6.0–8.3)

## 2020-11-01 LAB — CBC WITH DIFFERENTIAL/PLATELET
Basophils Absolute: 0.1 10*3/uL (ref 0.0–0.1)
Basophils Relative: 0.6 % (ref 0.0–3.0)
Eosinophils Absolute: 0 10*3/uL (ref 0.0–0.7)
Eosinophils Relative: 0.2 % (ref 0.0–5.0)
HCT: 38.2 % (ref 36.0–46.0)
Hemoglobin: 12.9 g/dL (ref 12.0–15.0)
Lymphocytes Relative: 9 % — ABNORMAL LOW (ref 12.0–46.0)
Lymphs Abs: 1 10*3/uL (ref 0.7–4.0)
MCHC: 33.9 g/dL (ref 30.0–36.0)
MCV: 90.7 fl (ref 78.0–100.0)
Monocytes Absolute: 0.6 10*3/uL (ref 0.1–1.0)
Monocytes Relative: 5.5 % (ref 3.0–12.0)
Neutro Abs: 9 10*3/uL — ABNORMAL HIGH (ref 1.4–7.7)
Neutrophils Relative %: 84.7 % — ABNORMAL HIGH (ref 43.0–77.0)
Platelets: 364 10*3/uL (ref 150.0–400.0)
RBC: 4.21 Mil/uL (ref 3.87–5.11)
RDW: 13.1 % (ref 11.5–15.5)
WBC: 10.7 10*3/uL — ABNORMAL HIGH (ref 4.0–10.5)

## 2020-11-01 LAB — C-REACTIVE PROTEIN: CRP: 11.4 mg/dL (ref 0.5–20.0)

## 2020-11-01 LAB — SEDIMENTATION RATE: Sed Rate: 58 mm/hr — ABNORMAL HIGH (ref 0–20)

## 2020-11-01 MED ORDER — PREDNISONE 10 MG PO TABS: ORAL_TABLET | ORAL | 0 refills | Status: DC

## 2020-11-01 MED ORDER — DICYCLOMINE HCL 20 MG PO TABS: 20.0000 mg | ORAL_TABLET | Freq: Four times a day (QID) | ORAL | 3 refills | Status: DC | PRN

## 2020-11-01 NOTE — Progress Notes (Signed)
Chief Complaint: Ulcerative colitis with flare  HPI:    Meagan Grimes is a 45 year old female with a past medical history of ulcerative colitis, known to Dr. Silverio Decamp, who was referred to me by Isaac Bliss, [place]* for a complaint of ulcerative colitis with diarrhea abdominal pain and rectal bleeding.    10/21/2016 colonoscopy with diffuse colitis in the left side of the colon, biopsy showed moderate inflammation consistent with UC.    09/20/2019 patient seen in clinic and at that time had been doing well until 2 weeks prior when she started an increase in diarrheal stools abdominal pain in her right upper quadrant.  Also noticed some bright red blood mixed in with bowel movements.  Still taking Lialda 4.8 g at that time.  At that time discussed that she needed to have a repeat colonoscopy as she had not had one in 3 years.  It was recommended that we treat her flare and see her in follow-up and then schedule.  She is started on a Prednisone taper.  CBC, CMP, CRP and ESR P were ordered.  Also ordered CT of the abdomen pelvis given right upper quadrant pain and firmness on exam.    09/20/2019 ESR elevated at 36, CMP with a low potassium at 3.0.  Patient was given potassium supplement.    09/29/2019 CT the abdomen pelvis with contrast showed no acute intra-abdominal or pelvic pathology.  She did have some thickening of the sigmoid colon which was thought related to her flare of UC.    Today, the patient tells me she was doing well on her Lialda 4.8 g daily until about 3 to 4 weeks ago.  Tells me that she had a change in her job where she was doing the work of 2 separate people with an increase in stress and then went on vacation and drank and ate more than she normally does and when she got home last week she was in a a lot of trouble.  Tells me she was having least 10-12 loose stools a day with abdominal cramping pain and spasms which "felt like contractions", also some blood in her stool.  She had some  Prednisone at home and started taking this 40 mg on Tuesday, 10/30/2020, tells me that she has had no change in symptoms yet but recalls that it may take 3 to 4 days before she feels any better.  Along with this notes a flare in her hemorrhoids describing "3" that I can feel on the outside.  Aware that she needs a colonoscopy.    Denies fever, chills or weight loss.  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     HUMIRA PEN 40 MG/0.4ML PNKT      hydrochlorothiazide (HYDRODIURIL) 25 MG tablet TAKE ONE TABLET BY MOUTH DAILY 90 tablet 0   hydrOXYzine (ATARAX/VISTARIL) 50 MG tablet Take 1 tablet (50 mg total) by mouth 3 (three) times daily as needed for anxiety. 90 tablet 3   mesalamine (LIALDA) 1.2 g EC tablet TAKE 4 TABLETS BY MOUTH EVERY MORNING 360 tablet  0   predniSONE (DELTASONE) 10 MG tablet Use as directed. 160 tablet 0   Current Facility-Administered Medications  Medication Dose Route Frequency Provider Last Rate Last Admin   0.9 %  sodium chloride infusion  500 mL Intravenous Continuous Nandigam, Venia Minks, MD        Allergies as of 11/01/2020   (No Known Allergies)    Family History  Problem Relation Age of Onset   Hypertension Mother    Hypertension Father    Mental illness Father    Lung cancer Maternal Grandmother    Hypertension Maternal Grandfather    Heart disease Maternal Grandfather    Heart disease Paternal Grandmother    Hypertension Paternal Grandmother    Lung cancer Maternal Aunt    Colon cancer Neg Hx    Esophageal cancer Neg Hx    Rectal cancer Neg Hx    Liver cancer Neg Hx    Stomach  cancer Neg Hx     Social History   Socioeconomic History   Marital status: Married    Spouse name: Not on file   Number of children: 3   Years of education: Not on file   Highest education level: Not on file  Occupational History   Occupation: IT trainer  Tobacco Use   Smoking status: Former   Smokeless tobacco: Never  Scientific laboratory technician Use: Never used  Substance and Sexual Activity   Alcohol use: Yes    Alcohol/week: 5.0 standard drinks    Types: 5 Glasses of wine per week   Drug use: No   Sexual activity: Yes    Partners: Male    Birth control/protection: None  Other Topics Concern   Not on file  Social History Narrative   Not on file   Social Determinants of Health   Financial Resource Strain: Not on file  Food Insecurity: Not on file  Transportation Needs: Not on file  Physical Activity: Not on file  Stress: Not on file  Social Connections: Not on file  Intimate Partner Violence: Not on file    Review of Systems:    Constitutional: No weight loss, fever or chills Cardiovascular: No chest pain Respiratory: No SOB Gastrointestinal: See HPI and otherwise negative   Physical Exam:  Vital signs: BP 130/80 (BP Location: Left Arm, Patient Position: Sitting, Cuff Size: Normal)   Pulse 96   Ht 5' 6.5" (1.689 m)   Wt 216 lb 6 oz (98.1 kg)   BMI 34.40 kg/m   Constitutional:   Pleasant overweight Caucasian female appears to be in NAD, Well developed, Well nourished, alert and cooperative Respiratory: Respirations even and unlabored. Lungs clear to auscultation bilaterally.   No wheezes, crackles, or rhonchi.  Cardiovascular: Normal S1, S2. No MRG. Regular rate and rhythm. No peripheral edema, cyanosis or pallor.  Gastrointestinal:  Soft, nondistended, moderate generalized TTP with some involuntary guarding. Normal bowel sounds. No appreciable masses or hepatomegaly. Rectal: Declines Psychiatric: Demonstrates good judgement and reason without abnormal  affect or behaviors.  No recent labs or imaging.  Assessment: 1.  Ulcerative colitis with flare: Worse over the past 3 weeks, started Prednisone 40 mg 2 days ago with no change in symptoms yet, 15 bowel movements a day with generalized abdominal cramping and pain with hematochezia  Plan: 1.  Continue Prednisone 40 mg daily x7 days with a decrease by 5 mg every week until finished.  Refilled prescription. 2.  Also started Dicyclomine 20 mg every 6 hours as needed for  abdominal cramping #60 with 3 refills. 3.  Prescribed Hydrocortisone ointment to be applied twice daily to hemorrhoids for 1 to 2 weeks. 4.  Ordered ESR, CRP, CBC and CMP. 5.  Discussed with patient that I will follow with her in 6 weeks.  She is overdue for her colonoscopy and this needs to be scheduled but we will wait until we have treated this current flare.  Ellouise Newer, PA-C [facility] 11/01/2020, 11:17 AM  Cc: Isaac Bliss, [place]*

## 2020-11-01 NOTE — Patient Instructions (Signed)
Your provider has requested that you go to the basement level for lab work before leaving today. Press "B" on the elevator. The lab is located at the first door on the left as you exit the elevator.  We have sent the following medications to your pharmacy for you to pick up at your convenience: Prednisone taper.  Dicyclomine 20 mg every 4-6 hours as needed for spasm.   If you are age 46 or older, your body mass index should be between 23-30. Your Body mass index is 34.4 kg/m. If this is out of the aforementioned range listed, please consider follow up with your Primary Care Provider.  If you are age 74 or younger, your body mass index should be between 19-25. Your Body mass index is 34.4 kg/m. If this is out of the aformentioned range listed, please consider follow up with your Primary Care Provider.   __________________________________________________________  The [facility] providers would like to encourage you to use [facility] to communicate with providers for non-urgent requests or questions.  Due to long hold times on the telephone, sending your provider a message by [facility] may be a faster and more efficient way to get a response.  Please allow 48 business hours for a response.  Please remember that this is for non-urgent requests.

## 2020-11-01 NOTE — Progress Notes (Signed)
Reviewed and agree with documentation and assessment and plan. K. Veena Joshuah Minella , MD   

## 2020-11-07 ENCOUNTER — Telehealth: Payer: Self-pay | Admitting: Physician Assistant

## 2020-11-07 NOTE — Telephone Encounter (Signed)
Spoke with with patient, she states that she was taking Prednisone 40 mg for 3 days last week and had no relief so she increased to 50 mg of Prednisone, she has been on the increased dose since last Friday. Pt is taking the Dicyclomine every 6 hours, she reports that this has helped some with the abdominal cramping but she continues to have diarrhea. Pt reports that she has lost about 7 lbs in a week. She reports BRB with every bowel movement, not much. Pt reports that she is having 15 small episodes of watery diarrhea daily. Pt states that she is trying to eat bananas, toast and chicken noodle soup. Pt is wondering if her medications need to be adjusted. Please advise, thanks.

## 2020-11-07 NOTE — Telephone Encounter (Signed)
Spoke with patient in regards to recommendations. Pt verbalized understanding and had no concerns at the end of the call.

## 2020-11-07 NOTE — Telephone Encounter (Signed)
Lm on vm for patient to return call 

## 2020-11-07 NOTE — Telephone Encounter (Signed)
Inbound call from patient. States she have begin taking 5 prednisone since Friday 9/2 and it does not seem to be working. Is asking if she should increase more or try different medication. Best contact number [phone]

## 2020-11-08 ENCOUNTER — Ambulatory Visit (INDEPENDENT_AMBULATORY_CARE_PROVIDER_SITE_OTHER): Payer: 59 | Admitting: Internal Medicine

## 2020-11-08 ENCOUNTER — Encounter: Payer: Self-pay | Admitting: Internal Medicine

## 2020-11-08 ENCOUNTER — Other Ambulatory Visit: Payer: Self-pay

## 2020-11-08 ENCOUNTER — Other Ambulatory Visit: Payer: Self-pay | Admitting: Physician Assistant

## 2020-11-08 VITALS — BP 122/82 | HR 85 | Temp 98.2°F | Ht 66.25 in | Wt 210.0 lb

## 2020-11-08 DIAGNOSIS — E559 Vitamin D deficiency, unspecified: Secondary | ICD-10-CM

## 2020-11-08 DIAGNOSIS — M069 Rheumatoid arthritis, unspecified: Secondary | ICD-10-CM | POA: Diagnosis not present

## 2020-11-08 DIAGNOSIS — E781 Pure hyperglyceridemia: Secondary | ICD-10-CM | POA: Insufficient documentation

## 2020-11-08 DIAGNOSIS — K5180 Other ulcerative colitis without complications: Secondary | ICD-10-CM | POA: Diagnosis not present

## 2020-11-08 DIAGNOSIS — E538 Deficiency of other specified B group vitamins: Secondary | ICD-10-CM | POA: Insufficient documentation

## 2020-11-08 DIAGNOSIS — F419 Anxiety disorder, unspecified: Secondary | ICD-10-CM

## 2020-11-08 DIAGNOSIS — I10 Essential (primary) hypertension: Secondary | ICD-10-CM

## 2020-11-08 DIAGNOSIS — Z0000 Encounter for general adult medical examination without abnormal findings: Secondary | ICD-10-CM | POA: Diagnosis not present

## 2020-11-08 LAB — CBC WITH DIFFERENTIAL/PLATELET
Basophils Absolute: 0 10*3/uL (ref 0.0–0.1)
Basophils Relative: 0.3 % (ref 0.0–3.0)
Eosinophils Absolute: 0 10*3/uL (ref 0.0–0.7)
Eosinophils Relative: 0.2 % (ref 0.0–5.0)
HCT: 39.6 % (ref 36.0–46.0)
Hemoglobin: 13.3 g/dL (ref 12.0–15.0)
Lymphocytes Relative: 10.4 % — ABNORMAL LOW (ref 12.0–46.0)
Lymphs Abs: 1.4 10*3/uL (ref 0.7–4.0)
MCHC: 33.6 g/dL (ref 30.0–36.0)
MCV: 91.4 fl (ref 78.0–100.0)
Monocytes Absolute: 0.7 10*3/uL (ref 0.1–1.0)
Monocytes Relative: 5.1 % (ref 3.0–12.0)
Neutro Abs: 11.1 10*3/uL — ABNORMAL HIGH (ref 1.4–7.7)
Neutrophils Relative %: 84 % — ABNORMAL HIGH (ref 43.0–77.0)
Platelets: 431 10*3/uL — ABNORMAL HIGH (ref 150.0–400.0)
RBC: 4.34 Mil/uL (ref 3.87–5.11)
RDW: 13.4 % (ref 11.5–15.5)
WBC: 13.2 10*3/uL — ABNORMAL HIGH (ref 4.0–10.5)

## 2020-11-08 LAB — COMPREHENSIVE METABOLIC PANEL
ALT: 11 U/L (ref 0–35)
AST: 9 U/L (ref 0–37)
Albumin: 3.5 g/dL (ref 3.5–5.2)
Alkaline Phosphatase: 68 U/L (ref 39–117)
BUN: 9 mg/dL (ref 6–23)
CO2: 33 mEq/L — ABNORMAL HIGH (ref 19–32)
Calcium: 9 mg/dL (ref 8.4–10.5)
Chloride: 93 mEq/L — ABNORMAL LOW (ref 96–112)
Creatinine, Ser: 0.93 mg/dL (ref 0.40–1.20)
GFR: 73.94 mL/min (ref >60.00)
Glucose, Bld: 86 mg/dL (ref 70–99)
Potassium: 4.2 mEq/L (ref 3.5–5.1)
Sodium: 137 mEq/L (ref 135–145)
Total Bilirubin: 0.4 mg/dL (ref 0.2–1.2)
Total Protein: 6.8 g/dL (ref 6.0–8.3)

## 2020-11-08 LAB — LIPID PANEL
Cholesterol: 164 mg/dL (ref 0–200)
HDL: 40.6 mg/dL (ref >39.00)
NonHDL: 123.24
Total CHOL/HDL Ratio: 4
Triglycerides: 315 mg/dL — ABNORMAL HIGH (ref 0.0–149.0)
VLDL: 63 mg/dL — ABNORMAL HIGH (ref 0.0–40.0)

## 2020-11-08 LAB — VITAMIN D 25 HYDROXY (VIT D DEFICIENCY, FRACTURES): VITD: 38.81 ng/mL (ref 30.00–100.00)

## 2020-11-08 LAB — VITAMIN B12: Vitamin B-12: 298 pg/mL (ref 211–911)

## 2020-11-08 LAB — HEMOGLOBIN A1C: Hgb A1c MFr Bld: 5.7 % (ref 4.6–6.5)

## 2020-11-08 LAB — LDL CHOLESTEROL, DIRECT: Direct LDL: 90 mg/dL

## 2020-11-08 LAB — TSH: TSH: 1.03 u[IU]/mL (ref 0.35–5.50)

## 2020-11-08 MED ORDER — SERTRALINE HCL 50 MG PO TABS: 50.0000 mg | ORAL_TABLET | Freq: Every day | ORAL | 1 refills | Status: DC

## 2020-11-08 NOTE — Progress Notes (Signed)
Established Patient Office Visit     This visit occurred during the SARS-CoV-2 public health emergency.  Safety protocols were in place, including screening questions prior to the visit, additional usage of staff PPE, and extensive cleaning of exam room while observing appropriate contact time as indicated for disinfecting solutions.    CC/Reason for Visit: Annual preventive exam and discuss anxiety  HPI: HILMA STEINHILBER is a 45 y.o. female who is coming in today for the above mentioned reasons. Past Medical History is significant for: Hypertension, rheumatoid arthritis and ulcerative colitis.  She is currently going through a UC flare and is on high-dose prednisone as directed by GI.  She has routine eye care, she is overdue for dental care.  Her last colonoscopy was in 2018 and she is well overdue as she is supposed to have one every year.  She follows with GYN routinely, had a mammogram earlier this year and believes she will be due for a Pap smear next year.  She is due to get her flu vaccine but declines today due to her UC flare.  She would like to discuss her anxiety.  She has always had low level anxiety but feels like lately it has significantly increased.   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 She has never used smokeless tobacco. She reports current alcohol use of about 5.0 standard drinks per week. She reports that she does not use drugs.  Allergies: No Known Allergies  Family History:  Family History  Problem Relation Age of Onset   Hypertension Mother    Hypertension Father    Mental illness Father    Lung cancer Maternal Grandmother    Hypertension  Maternal Grandfather    Heart disease Maternal Grandfather    Heart disease Paternal Grandmother    Hypertension Paternal Grandmother    Lung cancer Maternal Aunt    Colon cancer Neg Hx    Esophageal cancer Neg Hx    Rectal cancer Neg Hx    Liver cancer Neg Hx    Stomach cancer Neg Hx      Current Outpatient Medications:    clobetasol ointment (TEMOVATE) 1.61 %, Apply 1 application topically as needed., Disp: , Rfl:    HUMIRA PEN 40 MG/0.4ML PNKT, , Disp: , Rfl:    hydrochlorothiazide (HYDRODIURIL) 25 MG tablet, TAKE ONE TABLET BY MOUTH DAILY, Disp: 90 tablet, Rfl: 0   mesalamine (LIALDA) 1.2 g EC tablet, TAKE 4 TABLETS BY MOUTH EVERY MORNING, Disp: 360 tablet, Rfl: 0   predniSONE (DELTASONE) 10 MG tablet, Take 4 tablets (40 mg total) by mouth daily for 7 days, THEN 3.5 tablets (35 mg total) daily for 7 days, THEN 3 tablets (30 mg total) daily for 7 days, THEN 2.5 tablets (25 mg total) daily for 7 days, THEN 2 tablets (20 mg total) daily for 7 days, THEN 1.5 tablets (15 mg total) daily for 7 days, THEN 1 tablet (10 mg total) daily for 7 days, THEN 0.5 tablets (5 mg total) daily for 7 days. Use as directed.., Disp: 126 tablet, Rfl: 0   sertraline (ZOLOFT)  50 MG tablet, Take 1 tablet (50 mg total) by mouth daily., Disp: 90 tablet, Rfl: 1   hydrOXYzine (ATARAX/VISTARIL) 50 MG tablet, Take 1 tablet (50 mg total) by mouth 3 (three) times daily as needed for anxiety. (Patient not taking: Reported on 11/08/2020), Disp: 90 tablet, Rfl: 3  Current Facility-Administered Medications:    0.9 %  sodium chloride infusion, 500 mL, Intravenous, Continuous, Nandigam, Kavitha V, MD  Review of Systems:  Constitutional: Denies fever, chills, diaphoresis, appetite change and fatigue.  HEENT: Denies photophobia, eye pain, redness, hearing loss, ear pain, congestion, sore throat, rhinorrhea, sneezing, mouth sores, trouble swallowing, neck pain, neck stiffness and tinnitus.   Respiratory: Denies SOB, DOE, cough,  chest tightness,  and wheezing.   Cardiovascular: Denies chest pain, palpitations and leg swelling.  Gastrointestinal: Denies nausea, vomiting,  constipation, blood in stool and abdominal distention.  Genitourinary: Denies dysuria, urgency, frequency, hematuria, flank pain and difficulty urinating.  Endocrine: Denies: hot or cold intolerance, sweats, changes in hair or nails, polyuria, polydipsia. Musculoskeletal: Denies myalgias, back pain, joint swelling, arthralgias and gait problem.  Skin: Denies pallor, rash and wound.  Neurological: Denies dizziness, seizures, syncope, weakness, light-headedness, numbness and headaches.  Hematological: Denies adenopathy. Easy bruising, personal or family bleeding history  Psychiatric/Behavioral: Denies suicidal ideation, mood changes, confusion and agitation    Physical Exam: Vitals:   11/08/20 0902  BP: 122/82  Pulse: 85  Temp: 98.2 F (36.8 C)  TempSrc: Oral  SpO2: 98%  Weight: 210 lb (95.3 kg)  Height: 5' 6.25" (1.683 m)    Body mass index is 33.64 kg/m.   Constitutional: NAD, calm, comfortable Eyes: PERRL, lids and conjunctivae normal ENMT: Mucous membranes are moist. Posterior pharynx clear of any exudate or lesions. Normal dentition. Tympanic membrane is pearly white, no erythema or bulging. Neck: normal, supple, no masses, no thyromegaly Respiratory: clear to auscultation bilaterally, no wheezing, no crackles. Normal respiratory effort. No accessory muscle use.  Cardiovascular: Regular rate and rhythm, no murmurs / rubs / gallops. No extremity edema. 2+ pedal pulses. No carotid bruits.  Abdomen: no tenderness, no masses palpated. No hepatosplenomegaly. Bowel sounds positive.  Musculoskeletal: no clubbing / cyanosis. No joint deformity upper and lower extremities. Good ROM, no contractures. Normal muscle tone.  Skin: no rashes, lesions, ulcers. No induration Neurologic: CN 2-12 grossly intact. Sensation intact, DTR normal. Strength  5/5 in all 4.  Psychiatric: Normal judgment and insight. Alert and oriented x 3. Normal mood.    Impression and Plan:  Encounter for preventive health examination -She has routine eye care, have advised routine dental care.  Holter she is due for her remarkable normochromic today we will. Home form labs to be updated today. -Healthy lifestyle discussed in detail.  Well-controlled.  She is overdue for screening colonoscopy, she has an appointment in October for this. -She had a mammogram earlier this year through her GYN, she tells me that her Pap smear is due next year as well.  Other ulcerative colitis without complication (HCC) -Currently with a UC flare on high-dose prednisone 50 mg, received Humira this week.  Followed by GI  Rheumatoid arthritis, involving unspecified site, unspecified whether rheumatoid factor present ([place]) -On Humira  Primary hypertension -Blood pressures well controlled on hydrochlorothiazide daily.  Vitamin D deficiency  - Plan: VITAMIN D 25 Hydroxy (Vit-D Deficiency, Fractures)  Anxiety  - Plan: sertraline (ZOLOFT) 50 MG tablet Flowsheet Row Office Visit from 11/08/2020 in [place] at [facility] Total Score 11  Patient Instructions  -Nice seeing you today!!  -Lab work today; will notify you once results are available.  -Start sertraline 50 mg daily.  -remember your flu vaccine and COVID booster.  -Schedule follow up in 6 months.      Lelon Frohlich, MD [facility]

## 2020-11-08 NOTE — Patient Instructions (Signed)
-  Nice seeing you today!!  -Lab work today; will notify you once results are available.  -Start sertraline 50 mg daily.  -remember your flu vaccine and COVID booster.  -Schedule follow up in 6 months.

## 2020-11-09 ENCOUNTER — Encounter: Payer: 59 | Admitting: Internal Medicine

## 2020-11-21 DIAGNOSIS — R1011 Right upper quadrant pain: Secondary | ICD-10-CM

## 2020-11-21 DIAGNOSIS — R198 Other specified symptoms and signs involving the digestive system and abdomen: Secondary | ICD-10-CM

## 2020-11-21 DIAGNOSIS — K51919 Ulcerative colitis, unspecified with unspecified complications: Secondary | ICD-10-CM

## 2020-11-21 MED ORDER — PREDNISONE 10 MG PO TABS: ORAL_TABLET | ORAL | 0 refills | Status: DC

## 2020-11-21 NOTE — Telephone Encounter (Signed)
Spoke with patient in regards to recommendations. Pt has been advised to expect a call from radiology scheduling to set up her CT scan. Pt states that she will come in for labs tomorrow. Patient has been scheduled for a virtual pre-visit on Friday, 12/14/20 at 4 PM. Patient is scheduled for a colonoscopy in the [place] with Dr. Silverio Decamp on Thursday, 12/20/20 at 8:30 am. Patient is aware that she will need to arrive at 7:30 am with a care partner. Patient is scheduled for a follow up with Ellouise Newer, PA-C on Thursday, 12/27/20 at 11:30 am. Patient is aware that I will send recommendations to her my chart as well for her review later. Pt verbalized understanding of all information and had no concerns at the end of the call.  CT order in epic. Secure staff message sent to radiology schedulers to contact patient to schedule.  Lab orders in epic.   Prednisone taper refilled, sent to pharmacy on file.

## 2020-11-22 ENCOUNTER — Other Ambulatory Visit: Payer: 59

## 2020-11-22 DIAGNOSIS — R198 Other specified symptoms and signs involving the digestive system and abdomen: Secondary | ICD-10-CM

## 2020-11-22 DIAGNOSIS — K51919 Ulcerative colitis, unspecified with unspecified complications: Secondary | ICD-10-CM

## 2020-11-22 DIAGNOSIS — R1011 Right upper quadrant pain: Secondary | ICD-10-CM

## 2020-11-23 ENCOUNTER — Other Ambulatory Visit: Payer: Self-pay | Admitting: Physician Assistant

## 2020-11-24 ENCOUNTER — Other Ambulatory Visit: Payer: Self-pay | Admitting: Physician Assistant

## 2020-11-26 ENCOUNTER — Other Ambulatory Visit: Payer: Self-pay

## 2020-11-27 ENCOUNTER — Other Ambulatory Visit (INDEPENDENT_AMBULATORY_CARE_PROVIDER_SITE_OTHER): Payer: 59

## 2020-11-27 ENCOUNTER — Other Ambulatory Visit: Payer: Self-pay

## 2020-11-27 DIAGNOSIS — K5100 Ulcerative (chronic) pancolitis without complications: Secondary | ICD-10-CM | POA: Diagnosis not present

## 2020-11-27 DIAGNOSIS — R197 Diarrhea, unspecified: Secondary | ICD-10-CM

## 2020-11-27 LAB — CBC WITH DIFFERENTIAL/PLATELET
Basophils Absolute: 0 10*3/uL (ref 0.0–0.1)
Basophils Relative: 0.5 % (ref 0.0–3.0)
Eosinophils Absolute: 0 10*3/uL (ref 0.0–0.7)
Eosinophils Relative: 0 % (ref 0.0–5.0)
HCT: 36 % (ref 36.0–46.0)
Hemoglobin: 12.2 g/dL (ref 12.0–15.0)
Lymphocytes Relative: 14.1 % (ref 12.0–46.0)
Lymphs Abs: 1.3 10*3/uL (ref 0.7–4.0)
MCHC: 34 g/dL (ref 30.0–36.0)
MCV: 90.5 fl (ref 78.0–100.0)
Monocytes Absolute: 0.3 10*3/uL (ref 0.1–1.0)
Monocytes Relative: 3.6 % (ref 3.0–12.0)
Neutro Abs: 7.3 10*3/uL (ref 1.4–7.7)
Neutrophils Relative %: 81.8 % — ABNORMAL HIGH (ref 43.0–77.0)
Platelets: 454 10*3/uL — ABNORMAL HIGH (ref 150.0–400.0)
RBC: 3.97 Mil/uL (ref 3.87–5.11)
RDW: 13.8 % (ref 11.5–15.5)
WBC: 9 10*3/uL (ref 4.0–10.5)

## 2020-11-27 LAB — COMPREHENSIVE METABOLIC PANEL
ALT: 9 U/L (ref 0–35)
AST: 8 U/L (ref 0–37)
Albumin: 3.2 g/dL — ABNORMAL LOW (ref 3.5–5.2)
Alkaline Phosphatase: 67 U/L (ref 39–117)
BUN: 9 mg/dL (ref 6–23)
CO2: 30 mEq/L (ref 19–32)
Calcium: 9.2 mg/dL (ref 8.4–10.5)
Chloride: 96 mEq/L (ref 96–112)
Creatinine, Ser: 0.85 mg/dL (ref 0.40–1.20)
GFR: 82.33 mL/min (ref >60.00)
Glucose, Bld: 120 mg/dL — ABNORMAL HIGH (ref 70–99)
Potassium: 3.4 mEq/L — ABNORMAL LOW (ref 3.5–5.1)
Sodium: 136 mEq/L (ref 135–145)
Total Bilirubin: 0.3 mg/dL (ref 0.2–1.2)
Total Protein: 6.6 g/dL (ref 6.0–8.3)

## 2020-11-27 LAB — SEDIMENTATION RATE: Sed Rate: 38 mm/hr — ABNORMAL HIGH (ref 0–20)

## 2020-11-28 ENCOUNTER — Other Ambulatory Visit: Payer: 59

## 2020-11-28 DIAGNOSIS — K5100 Ulcerative (chronic) pancolitis without complications: Secondary | ICD-10-CM

## 2020-11-28 DIAGNOSIS — R197 Diarrhea, unspecified: Secondary | ICD-10-CM

## 2020-11-28 NOTE — Telephone Encounter (Signed)
Dr Silverio Decamp can this patient have this refills?

## 2020-11-30 LAB — CLOSTRIDIUM DIFFICILE BY PCR: Toxigenic C. Difficile by PCR: NEGATIVE

## 2020-12-01 LAB — GI PROFILE, STOOL, PCR

## 2020-12-02 LAB — SERIAL MONITORING

## 2020-12-03 LAB — ADALIMUMAB+AB (SERIAL MONITOR)
Adalimumab Drug Level: 2.4 ug/mL
Anti-Adalimumab Antibody: <25 ng/mL

## 2020-12-04 ENCOUNTER — Telehealth: Payer: Self-pay

## 2020-12-04 ENCOUNTER — Other Ambulatory Visit: Payer: Self-pay

## 2020-12-04 MED ORDER — HUMIRA (2 PEN) 40 MG/0.4ML ~~LOC~~ AJKT: 40.0000 mg | AUTO-INJECTOR | SUBCUTANEOUS | 6 refills | Status: DC

## 2020-12-04 NOTE — Telephone Encounter (Signed)
Confirmed the dose and frequency of the Humira injections. I will start the PA since we are increasing this. Do you want me to send anything to her rheumatologist?

## 2020-12-04 NOTE — Telephone Encounter (Signed)
Thank you for checking.  We will need to increase Humira dosing to weekly because her drug level was subtherapeutic.  Please let me know if you need additional documentation for insurance approval.  Thanks

## 2020-12-04 NOTE — Telephone Encounter (Signed)
Called the patient. No answer. Left her a message on her voicemail. Inquiring on how often she injects her Humira. What pharmacy supplies her the Humira? Also sent her a message through My Chart.

## 2020-12-04 NOTE — Telephone Encounter (Signed)
Patient returned your call said she take 1 shot every other week and she last took her dose this Saturday and she gets it from [place] Tel: [phone].

## 2020-12-05 ENCOUNTER — Other Ambulatory Visit: Payer: Self-pay | Admitting: Physician Assistant

## 2020-12-06 NOTE — Telephone Encounter (Signed)
The patient will sart the weekly dosing this week with the supply that she has. The PA has been submitted through My Chart. Patient is registered in [employer] for tracking purposes and to assist with her reimbursement.

## 2020-12-10 ENCOUNTER — Other Ambulatory Visit: Payer: Self-pay

## 2020-12-10 ENCOUNTER — Telehealth: Payer: Self-pay

## 2020-12-10 DIAGNOSIS — K51919 Ulcerative colitis, unspecified with unspecified complications: Secondary | ICD-10-CM

## 2020-12-10 NOTE — Telephone Encounter (Signed)
Patient's PA  for Humira to increase to every week has been denied. Our Dx: for this request was subtherapeutic trough level. Ins. Is requiring an Appeal for this, and patient's  existing PA for the current every 2 weeks has expired. Gave patient a sample Humira-36m pen and she is going to come into our lab tomorrow for a QuantiFERON-TB gold test. When this is resulted we can resubmit the PA for patient's previous every 2 week regiment (which should go through quickly) while waiting to get an appeal approved for every week

## 2020-12-11 ENCOUNTER — Other Ambulatory Visit: Payer: 59

## 2020-12-11 DIAGNOSIS — K51919 Ulcerative colitis, unspecified with unspecified complications: Secondary | ICD-10-CM

## 2020-12-12 NOTE — Telephone Encounter (Signed)
Dr Silverio Decamp The increase of Humira to weekly dosing has been denied. Do you want an appeal?

## 2020-12-12 NOTE — Telephone Encounter (Signed)
12/22/20 Typo

## 2020-12-12 NOTE — Telephone Encounter (Signed)
Peer to peer is scheduled for 03/22/20 at 1:00 pm. I am waiting for confirmation of the appointment.  They have your cell and desk number. Sometimes they make text before calling.

## 2020-12-12 NOTE — Telephone Encounter (Signed)
Do you mean 12/20/20 at 1 PM not 1/20 correct ?

## 2020-12-12 NOTE — Telephone Encounter (Signed)
Yes, we need to appeal it, please check if I can do a peer to peer. Thanks

## 2020-12-14 ENCOUNTER — Telehealth: Payer: Self-pay | Admitting: Gastroenterology

## 2020-12-14 ENCOUNTER — Ambulatory Visit (AMBULATORY_SURGERY_CENTER): Payer: 59 | Admitting: *Deleted

## 2020-12-14 ENCOUNTER — Encounter: Payer: Self-pay | Admitting: Gastroenterology

## 2020-12-14 ENCOUNTER — Other Ambulatory Visit: Payer: Self-pay

## 2020-12-14 VITALS — Ht 66.5 in | Wt 203.0 lb

## 2020-12-14 DIAGNOSIS — K51919 Ulcerative colitis, unspecified with unspecified complications: Secondary | ICD-10-CM

## 2020-12-14 DIAGNOSIS — K5100 Ulcerative (chronic) pancolitis without complications: Secondary | ICD-10-CM

## 2020-12-14 MED ORDER — PEG-KCL-NACL-NASULF-NA ASC-C 100 G PO SOLR: 1.0000 | Freq: Once | ORAL | 0 refills | Status: DC

## 2020-12-14 NOTE — Progress Notes (Signed)
Virtual pre visit completed over telephone. Instructions forwarded through [place].    No egg or soy allergy known to patient  No issues known to pt with past sedation with any surgeries or procedures Patient denies ever being told they had issues or difficulty with intubation  No FH of Malignant Hyperthermia Pt is not on diet pills Pt is not on  home 02  Pt is not on blood thinners  Pt denies issues with constipation  No A fib or A flutter  Pt is fully vaccinated  for Covid  Discussed with pt there will be an out-of-pocket cost for prep and that varies from $0 to 70 +  dollars - pt verbalized understanding   Due to the COVID-19 pandemic we are asking patients to follow certain guidelines in [facility] and the [place]   Pt aware of COVID protocols and [facility] guidelines

## 2020-12-14 NOTE — Telephone Encounter (Signed)
Changed to [place] My Chart instructions for Suprep   Called pt- we went over Suprep instructions over the phone    Pt to call I she has other issues or problems   Lelan Pons [facility]

## 2020-12-14 NOTE — Telephone Encounter (Signed)
Physician called from insurance company, because he is not available for the scheduled peer- to peer review next week.  He was provided a brief hx and current symptoms.  He stated that " that should be good enough, I will pass it along".  He did not provide me the authorization, stated someone will be in touch.

## 2020-12-15 LAB — QUANTIFERON-TB GOLD PLUS
Mitogen-NIL: >10 IU/mL
NIL: 0.1 IU/mL
QuantiFERON-TB Gold Plus: NEGATIVE
TB1-NIL: 0.01 IU/mL
TB2-NIL: 0.01 IU/mL

## 2020-12-18 ENCOUNTER — Ambulatory Visit: Payer: 59 | Admitting: Physician Assistant

## 2020-12-18 NOTE — Telephone Encounter (Signed)
Medication was approved for weekly dosing. The patient was notified by insurance on Friday 12/14/20. I confirmed with the patient that she was aware and has ordered the Humira. PA # [id number]

## 2020-12-18 NOTE — Telephone Encounter (Signed)
Ok thank you [place], can you please follow up and let me know if need to do anything else for approval. Thanks

## 2020-12-19 ENCOUNTER — Encounter: Payer: Self-pay | Admitting: Certified Registered Nurse Anesthetist

## 2020-12-20 ENCOUNTER — Ambulatory Visit (AMBULATORY_SURGERY_CENTER): Payer: 59 | Admitting: Gastroenterology

## 2020-12-20 ENCOUNTER — Telehealth: Payer: Self-pay | Admitting: *Deleted

## 2020-12-20 ENCOUNTER — Other Ambulatory Visit: Payer: Self-pay

## 2020-12-20 ENCOUNTER — Encounter: Payer: Self-pay | Admitting: Gastroenterology

## 2020-12-20 VITALS — BP 128/84 | HR 80 | Temp 97.7°F | Resp 11 | Ht 66.5 in | Wt 203.0 lb

## 2020-12-20 DIAGNOSIS — K51919 Ulcerative colitis, unspecified with unspecified complications: Secondary | ICD-10-CM

## 2020-12-20 DIAGNOSIS — K529 Noninfective gastroenteritis and colitis, unspecified: Secondary | ICD-10-CM

## 2020-12-20 MED ORDER — SODIUM CHLORIDE 0.9 % IV SOLN: 500.0000 mL | Freq: Once | INTRAVENOUS | Status: DC

## 2020-12-20 MED ORDER — ORTIKOS 9 MG PO CP24: 9.0000 mg | ORAL_CAPSULE | Freq: Every day | ORAL | 2 refills | Status: DC

## 2020-12-20 NOTE — Patient Instructions (Signed)
Resume previous diet and continue present medications No aspirin, ibuprofen, naproxen, or other non-steroidal anti-inflammatory drugs. Awaiting pathology results Repeat colonoscopy date to be determined once pathology results are reviewed. Return to GI clinic at next available appointment Use Humira at 40 mg per 0.39m SQ every week Start Ortikos 9 mg daily for 2 months  YOU HAD AN ENDOSCOPIC PROCEDURE TODAY AT [facility]   Refer to the procedure report that was given to you for any specific questions about what was found during the examination.  If the procedure report does not answer your questions, please call your gastroenterologist to clarify.  If you requested that your care partner not be given the details of your procedure findings, then the procedure report has been included in a sealed envelope for you to review at your convenience later.  YOU SHOULD EXPECT: Some feelings of bloating in the abdomen. Passage of more gas than usual.  Walking can help get rid of the air that was put into your GI tract during the procedure and reduce the bloating. If you had a lower endoscopy (such as a colonoscopy or flexible sigmoidoscopy) you may notice spotting of blood in your stool or on the toilet paper. If you underwent a bowel prep for your procedure, you may not have a normal bowel movement for a few days.  Please Note:  You might notice some irritation and congestion in your nose or some drainage.  This is from the oxygen used during your procedure.  There is no need for concern and it should clear up in a day or so.  SYMPTOMS TO REPORT IMMEDIATELY:  Following lower endoscopy (colonoscopy or flexible sigmoidoscopy):  Excessive amounts of blood in the stool  Significant tenderness or worsening of abdominal pains  Swelling of the abdomen that is new, acute  Fever of 100F or higher  For urgent or emergent issues, a gastroenterologist can be reached at any hour by calling [phone]  [phone]. Do not use MyChart messaging for urgent concerns.    DIET:  We do recommend a small meal at first, but then you may proceed to your regular diet.  Drink plenty of fluids but you should avoid alcoholic beverages for 24 hours.  ACTIVITY:  You should plan to take it easy for the rest of today and you should NOT DRIVE or use heavy machinery until tomorrow (because of the sedation medicines used during the test).    FOLLOW UP: Our staff will call the number listed on your records 48-72 hours following your procedure to check on you and address any questions or concerns that you may have regarding the information given to you following your procedure. If we do not reach you, we will leave a message.  We will attempt to reach you two times.  During this call, we will ask if you have developed any symptoms of COVID 19. If you develop any symptoms (ie: fever, flu-like symptoms, shortness of breath, cough etc.) before then, please call [phone]  If you test positive for Covid 19 in the 2 weeks post procedure, please call and report this information to [facility]    If any biopsies were taken you will be contacted by phone or by letter within the next 1-3 weeks.  Please call [facility] ([phone] you have not heard about the biopsies in 3 weeks.    SIGNATURES/CONFIDENTIALITY: You and/or your care partner have signed paperwork which will be entered into your electronic medical record.  These  signatures attest to the fact that that the information above on your After Visit Summary has been reviewed and is understood.  Full responsibility of the confidentiality of this discharge information lies with you and/or your care-partner.

## 2020-12-20 NOTE — Progress Notes (Signed)
[facility] History and Physical   Primary Care Physician:  Isaac Bliss, Rayford Halsted, MD   Reason for Procedure:  Rectal bleeding, abdominal pain, UC flare  Plan:    Colonoscopy with possible interventions as needed     HPI: Meagan Grimes is a very pleasant 45 y.o. female here for Colonsocopy. She is having intermittent nausea,  abdominal pain, and bright red blood per rectum  The risks and benefits as well as alternatives of endoscopic procedure(s) have been discussed and reviewed. All questions answered. The patient agrees to proceed.  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 Authorizing Provider  cyanocobalamin 1000 MCG tablet Take 1,000 mcg by mouth daily.   Yes [provider]  dicyclomine (BENTYL) 20 MG tablet TAKE 1 TABLET (20 MG TOTAL) BY MOUTH EVERY 6 (SIX) HOURS AS NEEDED FOR SPASMS. 11/23/20  Yes Lemmon, Lavone Nian, PA  HUMIRA PEN 40 MG/0.4ML PNKT Inject 40 mg into the skin once a week. 12/04/20  Yes Daelynn Blower, Venia Minks, MD  hydrochlorothiazide (HYDRODIURIL) 25 MG tablet TAKE ONE TABLET BY MOUTH DAILY 09/21/20  Yes Isaac Bliss, Rayford Halsted, MD  mesalamine (LIALDA) 1.2 g EC tablet TAKE 4 TABLETS BY MOUTH EVERY MORNING 10/04/20  Yes Sharetha Newson, Venia Minks, MD  Omega-3 Fatty Acids (FISH OIL ADULT GUMMIES PO) Take by mouth.   Yes [provider]  omeprazole (PRILOSEC OTC) 20 MG tablet Take 20 mg by mouth daily.   Yes [provider]  Turmeric (QC TUMERIC COMPLEX PO) Take by mouth.   Yes [provider]  VITAMIN D, ERGOCALCIFEROL, PO Take 1,000  Units by mouth.   Yes [provider]  clobetasol ointment (TEMOVATE) 1.61 % Apply 1 application topically as needed.    [provider]  hydrOXYzine (ATARAX/VISTARIL) 50 MG tablet Take 1 tablet (50 mg total) by mouth 3 (three) times daily as needed for anxiety. Patient not taking: No sig reported 12/31/16   Vivi Barrack, MD  sertraline (ZOLOFT) 50 MG tablet Take 1 tablet (50 mg total) by mouth daily. Patient not taking: No sig reported 11/08/20   Isaac Bliss, Rayford Halsted, MD    Current Outpatient Medications  Medication Sig Dispense Refill   cyanocobalamin 1000 MCG tablet Take 1,000 mcg by mouth daily.     dicyclomine (BENTYL) 20 MG tablet TAKE 1 TABLET (20 MG TOTAL) BY MOUTH EVERY 6 (SIX) HOURS AS NEEDED FOR SPASMS. 60 tablet 3   HUMIRA PEN 40 MG/0.4ML PNKT Inject 40 mg into the skin once a week. 4 each 6   hydrochlorothiazide (HYDRODIURIL) 25 MG tablet TAKE ONE TABLET BY MOUTH DAILY 90 tablet 0   mesalamine (LIALDA) 1.2 g EC tablet TAKE 4 TABLETS BY MOUTH EVERY MORNING 360 tablet 0   Omega-3 Fatty Acids (FISH OIL ADULT GUMMIES PO) Take by mouth.     omeprazole (PRILOSEC OTC) 20 MG tablet Take 20 mg by mouth daily.     Turmeric (QC TUMERIC COMPLEX PO) Take by mouth.     VITAMIN D,  ERGOCALCIFEROL, PO Take 1,000 Units by mouth.     clobetasol ointment (TEMOVATE) 9.20 % Apply 1 application topically as needed.     hydrOXYzine (ATARAX/VISTARIL) 50 MG tablet Take 1 tablet (50 mg total) by mouth 3 (three) times daily as needed for anxiety. (Patient not taking: No sig reported) 90 tablet 3   sertraline (ZOLOFT) 50 MG tablet Take 1 tablet (50 mg total) by mouth daily. (Patient not taking: No sig reported) 90 tablet 1   Current Facility-Administered Medications  Medication Dose Route Frequency Provider Last Rate Last Admin   0.9 %  sodium chloride infusion  500 mL Intravenous Once Isaac Bliss, Rayford Halsted, MD        Allergies as of 12/20/2020   (No Known Allergies)     Family History  Problem Relation Age of Onset   Hypertension Mother    Hypertension Father    Mental illness Father    Lung cancer Maternal Aunt    Lung cancer Maternal Grandmother    Hypertension Maternal Grandfather    Heart disease Maternal Grandfather    Heart disease Paternal Grandmother    Hypertension Paternal Grandmother    Colon cancer Neg Hx    Esophageal cancer Neg Hx    Rectal cancer Neg Hx    Liver cancer Neg Hx    Stomach cancer Neg Hx    Colon polyps Neg Hx     Social History   Socioeconomic History   Marital status: Married    Spouse name: Not on file   Number of children: 3   Years of education: Not on file   Highest education level: Not on file  Occupational History   Occupation: IT trainer  Tobacco Use   Smoking status: Former   Smokeless tobacco: Never  Scientific laboratory technician Use: Never used  Substance and Sexual Activity   Alcohol use: Yes    Alcohol/week: 5.0 standard drinks    Types: 5 Glasses of wine per week   Drug use: No   Sexual activity: Yes    Partners: Male    Birth control/protection: None  Other Topics Concern   Not on file  Social History Narrative   Not on file   Social Determinants of Health   Financial Resource Strain: Not on file  Food Insecurity: Not on file  Transportation Needs: Not on file  Physical Activity: Not on file  Stress: Not on file  Social Connections: Not on file  Intimate Partner Violence: Not on file    Review of Systems:  All other review of systems negative except as mentioned in the HPI.  Physical Exam: Vital signs in last 24 hours: BP 131/73   Pulse 92   Temp 97.7 F (36.5 C) (Temporal)   Ht 5' 6.5" (1.689 m)   Wt 203 lb (92.1 kg)   SpO2 99%   BMI 32.27 kg/m     General:   Alert, NAD Lungs:  Clear .   Heart:  Regular rate and rhythm Abdomen:  Soft, nontender and nondistended. Neuro/Psych:  Alert and cooperative. Normal mood and affect. A and O x 3  Reviewed labs,  radiology imaging, old records and pertinent past GI work up  Patient is appropriate for planned procedure(s) and anesthesia in an ambulatory setting   K. Denzil Magnuson , MD [phone]

## 2020-12-20 NOTE — Op Note (Signed)
[facility] Patient Name: Meagan Grimes Procedure Date: 12/20/2020 8:41 AM MRN: [id number] Endoscopist: Mauri Pole , MD Age: 45 Referring MD:  Date of Birth: [birth year] Gender: Female Account #: [id number] Procedure:                Colonoscopy Indications:              Evaluation of unexplained GI bleeding presenting                            with Hematochezia, Determine extent and severity of                            inflammatory bowel disease, Clinically significant                            diarrhea of unexplained origin, Abnormal CT of the                            GI tract Medicines:                Monitored Anesthesia Care Procedure:                Pre-Anesthesia Assessment:                           - Prior to the procedure, a History and Physical                            was performed, and patient medications and                            allergies were reviewed. The patient's tolerance of                            previous anesthesia was also reviewed. The risks                            and benefits of the procedure and the sedation                            options and risks were discussed with the patient.                            All questions were answered, and informed consent                            was obtained. Prior Anticoagulants: The patient has                            taken no previous anticoagulant or antiplatelet                            agents. ASA Grade Assessment: II - A patient with  mild systemic disease. After reviewing the risks                            and benefits, the patient was deemed in                            satisfactory condition to undergo the procedure.                           After obtaining informed consent, the colonoscope                            was passed under direct vision. Throughout the                            procedure, the patient's blood pressure,  pulse, and                            oxygen saturations were monitored continuously. The                            PCF-HQ190L Colonoscope was introduced through the                            anus and advanced to the the descending colon for                            evaluation. This was the intended extent. The                            colonoscopy was performed without difficulty. The                            patient tolerated the procedure well. The quality                            of the bowel preparation was adequate. The rectum                            was photographed. Scope In: 8:53:02 AM Scope Out: 9:01:44 AM Scope Withdrawal Time: 0 hours 5 minutes 49 seconds  Total Procedure Duration: 0 hours 8 minutes 42 seconds  Findings:                 The perianal and digital rectal examinations were                            normal.                           A continuous area of bleeding ulcerated mucosa was                            present in the rectum, in the sigmoid colon and in  the descending colon. Biopsies were taken with a                            cold forceps for histology. Complications:            No immediate complications. Estimated Blood Loss:     Estimated blood loss was minimal. Impression:               - Mucosal ulceration. Biopsied. Recommendation:           - Resume previous diet.                           - Continue present medications.                           - No aspirin, ibuprofen, naproxen, or other                            non-steroidal anti-inflammatory drugs.                           - Await pathology results.                           - Repeat colonoscopy date to be determined after                            pending pathology results are reviewed for                            surveillance based on pathology results.                           - Return to GI clinic at the next available                             appointment.                           - Use Humira (adalimumab) at 40 mg per 0.8 mL                            subcutaneously every week.                           - Use Ortikos (budesonide) 9 mg PO daily for 2                            months Mauri Pole, MD 12/20/2020 9:11:40 AM This report has been signed electronically.

## 2020-12-20 NOTE — Progress Notes (Signed)
Called to room to assist during endoscopic procedure.  Patient ID and intended procedure confirmed with present staff. Received instructions for my participation in the procedure from the performing physician.  

## 2020-12-20 NOTE — Progress Notes (Signed)
VS-SH  Pt's states no medical or surgical changes since previsit or office visit.

## 2020-12-20 NOTE — Progress Notes (Signed)
Report given to PACU, vss 

## 2020-12-20 NOTE — Telephone Encounter (Signed)
Noted! Thank you

## 2020-12-20 NOTE — Telephone Encounter (Signed)
Meagan Grimes approved for Humira, you may already know since you was working on this. Thanks Sent to be scanned in

## 2020-12-23 ENCOUNTER — Other Ambulatory Visit: Payer: Self-pay | Admitting: Physician Assistant

## 2020-12-24 ENCOUNTER — Other Ambulatory Visit: Payer: Self-pay | Admitting: Internal Medicine

## 2020-12-24 ENCOUNTER — Telehealth: Payer: Self-pay

## 2020-12-24 DIAGNOSIS — I10 Essential (primary) hypertension: Secondary | ICD-10-CM

## 2020-12-24 NOTE — Telephone Encounter (Signed)
  Follow up Call-  Call back number 12/20/2020  Post procedure Call Back phone  # [phone]  Permission to leave phone message Yes  Some recent data might be hidden     Patient questions:  Do you have a fever, pain , or abdominal swelling? No. Pain Score  0 *  Have you tolerated food without any problems? Yes.    Have you been able to return to your normal activities? Yes.    Do you have any questions about your discharge instructions: Diet   No. Medications  No. Follow up visit  No.  Do you have questions or concerns about your Care? No.  Actions: * If pain score is 4 or above: No action needed, pain <4.

## 2020-12-25 ENCOUNTER — Telehealth: Payer: Self-pay | Admitting: *Deleted

## 2020-12-25 DIAGNOSIS — K51919 Ulcerative colitis, unspecified with unspecified complications: Secondary | ICD-10-CM

## 2020-12-25 MED ORDER — BUDESONIDE 3 MG PO CPEP: 9.0000 mg | ORAL_CAPSULE | Freq: Every day | ORAL | 3 refills | Status: DC

## 2020-12-25 NOTE — Telephone Encounter (Signed)
FYI Dr Silverio Decamp, Patients insurance would only cover the 3 mg budesonide capsules so iv got to send her a new rx for 3 capsules once daily

## 2020-12-25 NOTE — Telephone Encounter (Signed)
Mauri Pole, MD sent to [facility], [place]; [place], Real Cons, LPN; [facility], [place] Will need to repeat labs CBC, BMP and CRP/ESR and GI pathogen panel. Please request patient to clarify what symptoms she is experiencing. Will need to check if she has developed resistance to Humira, please order drug trough and Ab level. She is not scheduled for CT yet. Marland Kitchen She will need office visit soon with me or APP  Thanks   Labs entered -Shirlean Mylar

## 2020-12-26 NOTE — Telephone Encounter (Signed)
Labs already done patient needs office appt first. Will need CT scan after office visit  Scheduled f/u for 12/1

## 2020-12-26 NOTE — Telephone Encounter (Signed)
Ok its fine to use the generic Budesonide. Thanks

## 2020-12-26 NOTE — Telephone Encounter (Signed)
She already had the labs. Thanks

## 2020-12-27 ENCOUNTER — Ambulatory Visit: Payer: 59 | Admitting: Physician Assistant

## 2021-01-02 ENCOUNTER — Other Ambulatory Visit: Payer: Self-pay | Admitting: Physician Assistant

## 2021-01-18 ENCOUNTER — Other Ambulatory Visit: Payer: Self-pay | Admitting: Gastroenterology

## 2021-01-19 ENCOUNTER — Other Ambulatory Visit: Payer: Self-pay | Admitting: Gastroenterology

## 2021-01-31 ENCOUNTER — Ambulatory Visit: Payer: 59 | Admitting: Gastroenterology

## 2021-01-31 ENCOUNTER — Encounter: Payer: Self-pay | Admitting: Gastroenterology

## 2021-01-31 ENCOUNTER — Other Ambulatory Visit (INDEPENDENT_AMBULATORY_CARE_PROVIDER_SITE_OTHER): Payer: 59

## 2021-01-31 VITALS — BP 140/90 | HR 108 | Ht 66.5 in | Wt 210.2 lb

## 2021-01-31 DIAGNOSIS — E538 Deficiency of other specified B group vitamins: Secondary | ICD-10-CM

## 2021-01-31 DIAGNOSIS — K51919 Ulcerative colitis, unspecified with unspecified complications: Secondary | ICD-10-CM

## 2021-01-31 DIAGNOSIS — E559 Vitamin D deficiency, unspecified: Secondary | ICD-10-CM

## 2021-01-31 LAB — CBC WITH DIFFERENTIAL/PLATELET
Basophils Absolute: 0 10*3/uL (ref 0.0–0.1)
Basophils Relative: 0.5 % (ref 0.0–3.0)
Eosinophils Absolute: 0.1 10*3/uL (ref 0.0–0.7)
Eosinophils Relative: 0.5 % (ref 0.0–5.0)
HCT: 32.2 % — ABNORMAL LOW (ref 36.0–46.0)
Hemoglobin: 10.2 g/dL — ABNORMAL LOW (ref 12.0–15.0)
Lymphocytes Relative: 35.7 % (ref 12.0–46.0)
Lymphs Abs: 3.5 10*3/uL (ref 0.7–4.0)
MCHC: 31.6 g/dL (ref 30.0–36.0)
MCV: 84.2 fl (ref 78.0–100.0)
Monocytes Absolute: 0.8 10*3/uL (ref 0.1–1.0)
Monocytes Relative: 7.9 % (ref 3.0–12.0)
Neutro Abs: 5.4 10*3/uL (ref 1.4–7.7)
Neutrophils Relative %: 55.4 % (ref 43.0–77.0)
Platelets: 435 10*3/uL — ABNORMAL HIGH (ref 150.0–400.0)
RBC: 3.83 Mil/uL — ABNORMAL LOW (ref 3.87–5.11)
RDW: 16.5 % — ABNORMAL HIGH (ref 11.5–15.5)
WBC: 9.8 10*3/uL (ref 4.0–10.5)

## 2021-01-31 LAB — COMPREHENSIVE METABOLIC PANEL
ALT: 12 U/L (ref 0–35)
AST: 13 U/L (ref 0–37)
Albumin: 3.8 g/dL (ref 3.5–5.2)
Alkaline Phosphatase: 64 U/L (ref 39–117)
BUN: 12 mg/dL (ref 6–23)
CO2: 33 mEq/L — ABNORMAL HIGH (ref 19–32)
Calcium: 9.2 mg/dL (ref 8.4–10.5)
Chloride: 99 mEq/L (ref 96–112)
Creatinine, Ser: 1.03 mg/dL (ref 0.40–1.20)
GFR: 65.3 mL/min (ref >60.00)
Glucose, Bld: 104 mg/dL — ABNORMAL HIGH (ref 70–99)
Potassium: 3.2 mEq/L — ABNORMAL LOW (ref 3.5–5.1)
Sodium: 140 mEq/L (ref 135–145)
Total Bilirubin: 0.4 mg/dL (ref 0.2–1.2)
Total Protein: 7.5 g/dL (ref 6.0–8.3)

## 2021-01-31 LAB — SEDIMENTATION RATE: Sed Rate: 51 mm/hr — ABNORMAL HIGH (ref 0–20)

## 2021-01-31 NOTE — Progress Notes (Signed)
Meagan Grimes    [id number]    [birth year]  Primary Care Physician:Hernandez Everardo Beals, MD  Referring Physician: Isaac Bliss, Rayford Halsted, MD [place],  [place] [id number]   Chief complaint: Ulcerative colitis  HPI: 45 year old very pleasant female here for follow-up visit for ulcerative colitis. Overall her symptoms are improving on weekly Humira and budesonide 9 mg daily She is no longer having rectal bleeding or fecal urgency.  She continues to have increased bowel frequency with 3-4 semiformed bowel movements per day  Prior to increasing his dose of Humira she was on prolonged course of prednisone taper  Colonoscopy December 20, 2020 - The perianal and digital rectal examinations were normal. - A continuous area of bleeding ulcerated mucosa was present in the rectum, in the sigmoid colon and in the descending colon. Biopsies were taken with a cold forceps for histology. Surgical [P], colon nos, random sites - [place] WITH INFLAMMATORY BOWEL DISEASE. - NEGATIVE FOR DYSPLASIA. - SEE MICROSCOPIC DESCRIPTION   09/29/2019 CT the abdomen pelvis with contrast showed no acute intra-abdominal or pelvic pathology.  She did have some thickening of the sigmoid colon which was thought related to her flare of UC. Outpatient Encounter Medications as of 01/31/2021  Medication Sig   budesonide (ENTOCORT EC) 3 MG 24 hr capsule Take 3 capsules (9 mg total) by mouth daily.   clobetasol ointment (TEMOVATE) 2.12 % Apply 1 application topically as needed.   cyanocobalamin 1000 MCG tablet Take 1,000 mcg by mouth daily.   dicyclomine (BENTYL) 20 MG tablet TAKE 1 TABLET (20 MG TOTAL) BY MOUTH EVERY 6 (SIX) HOURS AS NEEDED FOR SPASMS.   HUMIRA PEN 40 MG/0.4ML PNKT Inject 40 mg into the skin once a week.   hydrochlorothiazide (HYDRODIURIL) 25 MG tablet TAKE ONE TABLET BY MOUTH DAILY   Omega-3 Fatty Acids (FISH OIL ADULT GUMMIES  PO) Take by mouth.   omeprazole (PRILOSEC OTC) 20 MG tablet Take 20 mg by mouth daily.   Turmeric (QC TUMERIC COMPLEX PO) Take by mouth.   VITAMIN D, ERGOCALCIFEROL, PO Take 1,000 Units by mouth.   sertraline (ZOLOFT) 50 MG tablet Take 1 tablet (50 mg total) by mouth daily. (Patient not taking: Reported on 12/14/2020)   [DISCONTINUED] hydrOXYzine (ATARAX/VISTARIL) 50 MG tablet Take 1 tablet (50 mg total) by mouth 3 (three) times daily as needed for anxiety. (Patient not taking: No sig reported)   [DISCONTINUED] mesalamine (LIALDA) 1.2 g EC tablet TAKE 4 TABLETS BY MOUTH EVERY MORNING   [DISCONTINUED] predniSONE (DELTASONE) 10 MG tablet TAKE 4 TABLETS (40 MG TOTAL) BY MOUTH DAILY FOR 7 DAYS, THEN 3.5 TABLETS (35 MG TOTAL) DAILY FOR 7 DAYS, THEN 3 TABLETS (30 MG TOTAL) DAILY FOR 7 DAYS, THEN 2.5 TABLETS (25 MG TOTAL) DAILY FOR 7 DAYS, THEN 2 TABLETS (20 MG TOTAL) DAILY FOR 7 DAYS, THEN 1.5 TABLETS (15 MG TOTAL) DAILY FOR 7 DAYS, THEN 1 TABLET (10 MG TOTAL) DAILY FOR 7 DAYS, THEN 0.5 TABLETS (5 MG TOTAL) DAILY FOR 7 DAYS. USE AS DIRECTED.Marland Kitchen   No facility-administered encounter medications on file as of 01/31/2021.    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[place])    Crohn's disease ([place])    Hypertension    Rheumatoid arthritis ([place])  Past Surgical History:  Procedure Laterality Date   CESAREAN SECTION     COLONOSCOPY     LASER ABLATION      Family History  Problem Relation Age of Onset   Hypertension Mother    Hypertension Father    Mental illness Father    Lung cancer Maternal Aunt    Lung cancer Maternal Grandmother    Hypertension Maternal Grandfather    Heart disease Maternal Grandfather    Heart disease Paternal Grandmother    Hypertension Paternal Grandmother    Colon cancer Neg Hx    Esophageal cancer Neg Hx    Rectal cancer Neg Hx    Liver cancer Neg Hx    Stomach  cancer Neg Hx    Colon polyps Neg Hx     Social History   Socioeconomic History   Marital status: Married    Spouse name: Not on file   Number of children: 3   Years of education: Not on file   Highest education level: Not on file  Occupational History   Occupation: IT trainer  Tobacco Use   Smoking status: Former   Smokeless tobacco: Never  Scientific laboratory technician Use: Never used  Substance and Sexual Activity   Alcohol use: Yes    Alcohol/week: 5.0 standard drinks    Types: 5 Glasses of wine per week   Drug use: No   Sexual activity: Yes    Partners: Male    Birth control/protection: None  Other Topics Concern   Not on file  Social History Narrative   Not on file   Social Determinants of Health   Financial Resource Strain: Not on file  Food Insecurity: Not on file  Transportation Needs: Not on file  Physical Activity: Not on file  Stress: Not on file  Social Connections: Not on file  Intimate Partner Violence: Not on file      Review of systems: All other review of systems negative except as mentioned in the HPI.   Physical Exam: Vitals:   01/31/21 0849  BP: 140/90  Pulse: (!) 108   Body mass index is 33.43 kg/m. Gen:      No acute distress HEENT:  sclera anicteric Abd:      soft, non-tender; no palpable masses, no distension Ext:    No edema Neuro: alert and oriented x 3 Psych: normal mood and affect  Data Reviewed:  Reviewed labs, radiology imaging, old records and pertinent past GI work up   Assessment and Plan/Recommendations:  45 year old very pleasant female with history of hypertension, rheumatoid arthritis, psoriatic arthritis and ulcerative colitis  Clinically symptoms appear to be improving Continue Humira injection weekly, she had subtherapeutic drug trough 2.4 in September 2022 prior to increasing dose We will recheck drug trough in 3 to 4 months  Start to taper budesonide, decrease dose to 6 mg daily for 2 weeks followed by  3 mg daily for 4 weeks and stop Follow-up CBC, CMP and ESR for IBD health maintenance We will plan for surveillance colonoscopy in 6 months, scope was not extended beyond descending colon due to significant colonic inflammation and ulceration.  We will plan to evaluate for mucosal healing and for surveillance  Vitamin D deficiency: Continue oral vitamin D 400 international units daily B12 deficiency: Continue B12 supplement  Return in 2 to 3 months   The patient was provided an opportunity to ask questions and all were answered. The patient agreed with the plan and demonstrated an understanding  of the instructions.  Damaris Hippo , MD    CC: Isaac Bliss, [place]*

## 2021-01-31 NOTE — Patient Instructions (Signed)
Your provider has requested that you go to the basement level for lab work before leaving today. Press "B" on the elevator. The lab is located at the first door on the left as you exit the elevator.   Start Budesonide Taper: to 6 mg for 2 weeks then decrease 3 mg for 4 weeks  Follow up in 3 months   If you are age 46 or older, your body mass index should be between 23-30. Your Body mass index is 33.43 kg/m. If this is out of the aforementioned range listed, please consider follow up with your Primary Care Provider.  If you are age 89 or younger, your body mass index should be between 19-25. Your Body mass index is 33.43 kg/m. If this is out of the aformentioned range listed, please consider follow up with your Primary Care Provider.   ________________________________________________________  The [facility] providers would like to encourage you to use [facility] to communicate with providers for non-urgent requests or questions.  Due to long hold times on the telephone, sending your provider a message by [facility] may be a faster and more efficient way to get a response.  Please allow 48 business hours for a response.  Please remember that this is for non-urgent requests.  _______________________________________________________   Due to recent changes in healthcare laws, you may see the results of your imaging and laboratory studies on MyChart before your provider has had a chance to review them.  We understand that in some cases there may be results that are confusing or concerning to you. Not all laboratory results come back in the same time frame and the provider may be waiting for multiple results in order to interpret others.  Please give [place] 48 hours in order for your provider to thoroughly review all the results before contacting the office for clarification of your results.    I appreciate the  opportunity to care for you  Thank You   Harl Bowie , MD

## 2021-02-13 ENCOUNTER — Telehealth: Payer: Self-pay | Admitting: Internal Medicine

## 2021-02-13 NOTE — Telephone Encounter (Signed)
Called patient to schedule a follow up visit with Dr. Jerilee Hoh for March.  No answer, left voicemail message.  Schedule patient for follow up with Dr. Jerilee Hoh for around 05/08/21 if she calls back.

## 2021-03-23 ENCOUNTER — Other Ambulatory Visit: Payer: Self-pay | Admitting: Gastroenterology

## 2021-05-26 ENCOUNTER — Other Ambulatory Visit: Payer: Self-pay | Admitting: Internal Medicine

## 2021-05-26 DIAGNOSIS — I10 Essential (primary) hypertension: Secondary | ICD-10-CM

## 2021-06-05 ENCOUNTER — Other Ambulatory Visit: Payer: Self-pay | Admitting: Gastroenterology

## 2021-06-19 ENCOUNTER — Other Ambulatory Visit: Payer: Self-pay

## 2021-10-24 ENCOUNTER — Ambulatory Visit: Payer: 59 | Admitting: Internal Medicine

## 2021-10-24 ENCOUNTER — Encounter: Payer: Self-pay | Admitting: Internal Medicine

## 2021-10-24 VITALS — BP 130/80 | HR 76 | Temp 98.2°F | Wt 221.3 lb

## 2021-10-24 DIAGNOSIS — R1011 Right upper quadrant pain: Secondary | ICD-10-CM

## 2021-10-24 NOTE — Progress Notes (Signed)
Established Patient Office Visit     CC/Reason for Visit: Discuss right upper quadrant abdominal pain  HPI: Meagan Grimes is a 46 y.o. female who is coming in today for the above mentioned reasons. Past Medical History is significant for: Hypertension, rheumatoid arthritis and ulcerative colitis.  She has been having right upper quadrant pain now for a few months.  Yesterday she had a bad episode which prompted her to schedule this visit.  She is not known to have any gallbladder issues.  The pain will come in waves.  Some nausea but no vomiting.  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 She has never used smokeless tobacco. She reports current alcohol use of about 5.0 standard drinks of alcohol per week. She reports that she does not use drugs.  Allergies: No Known Allergies  Family History:  Family History  Problem Relation Age of Onset   Hypertension Mother    Hypertension Father    Mental illness Father    Lung cancer Maternal Aunt    Lung cancer Maternal Grandmother    Hypertension Maternal Grandfather    Heart disease Maternal Grandfather    Heart disease Paternal Grandmother    Hypertension Paternal Grandmother    Colon cancer Neg Hx    Esophageal cancer Neg Hx    Rectal cancer Neg Hx    Liver cancer Neg Hx    Stomach cancer Neg Hx    Colon polyps Neg Hx      Current Outpatient Medications:    budesonide (ENTOCORT EC) 3 MG 24 hr capsule, TAKE 3 CAPSULES (9 MG TOTAL) BY MOUTH DAILY., Disp: 270 capsule, Rfl: 1   clobetasol ointment (TEMOVATE) 9.52 %, Apply 1 application topically as needed., Disp: , Rfl:    cyanocobalamin 1000 MCG  tablet, Take 1,000 mcg by mouth daily., Disp: , Rfl:    dicyclomine (BENTYL) 20 MG tablet, TAKE 1 TABLET (20 MG TOTAL) BY MOUTH EVERY 6 (SIX) HOURS AS NEEDED FOR SPASMS., Disp: 360 tablet, Rfl: 1   HUMIRA PEN 40 MG/0.4ML PNKT, INJECT 40MG INTO THE SKIN ONCE A WEEK, Disp: 4 each, Rfl: 5   hydrochlorothiazide (HYDRODIURIL) 25 MG tablet, TAKE ONE TABLET BY MOUTH DAILY, Disp: 90 tablet, Rfl: 1   Omega-3 Fatty Acids (FISH OIL ADULT GUMMIES PO), Take by mouth., Disp: , Rfl:    omeprazole (PRILOSEC OTC) 20 MG tablet, Take 20 mg by mouth daily., Disp: , Rfl:    VITAMIN D, ERGOCALCIFEROL, PO, Take 1,000 Units by mouth., Disp: , Rfl:   Review of Systems:  Constitutional: Denies fever, chills, diaphoresis, appetite change and fatigue.  HEENT: Denies photophobia, eye pain, redness, hearing loss, ear pain, congestion, sore throat, rhinorrhea, sneezing, mouth sores, trouble swallowing, neck pain, neck stiffness and tinnitus.   Respiratory: Denies SOB, DOE, cough, chest tightness,  and wheezing.   Cardiovascular: Denies chest pain, palpitations and leg swelling.  Gastrointestinal: Denies  vomiting, diarrhea, constipation, blood in stool and abdominal distention.  Genitourinary: Denies dysuria, urgency, frequency, hematuria, flank pain and difficulty urinating.  Endocrine: Denies: hot or cold  intolerance, sweats, changes in hair or nails, polyuria, polydipsia. Musculoskeletal: Denies myalgias, back pain, joint swelling, arthralgias and gait problem.  Skin: Denies pallor, rash and wound.  Neurological: Denies dizziness, seizures, syncope, weakness, light-headedness, numbness and headaches.  Hematological: Denies adenopathy. Easy bruising, personal or family bleeding history  Psychiatric/Behavioral: Denies suicidal ideation, mood changes, confusion, nervousness, sleep disturbance and agitation    Physical Exam: Vitals:   10/24/21 1558  BP: 130/80  Pulse: 76  Temp: 98.2 F (36.8 C)  TempSrc: Oral  SpO2:  99%  Weight: 221 lb 4.8 oz (100.4 kg)    Body mass index is 35.18 kg/m.   Constitutional: NAD, calm, comfortable Eyes: PERRL, lids and conjunctivae normal ENMT: Mucous membranes are moist.  Abdomen: Slight tenderness to palpation of the right upper quadrant, especially with deep inspiration.  No masses palpated. No hepatosplenomegaly. Bowel sounds positive.   Psychiatric: Normal judgment and insight. Alert and oriented x 3. Normal mood.    Impression and Plan:  RUQ pain - Plan: US ABDOMEN LIMITED RUQ (LIVER/GB), Comprehensive metabolic panel, CBC with Differential/Platelet  -Suspicious for biliary colic. -Check labs and right upper quadrant ultrasound.  Further work-up to follow.  Time spent:32 minutes reviewing chart, interviewing and examining patient and formulating plan of care.     Lelon Frohlich, MD [facility]

## 2021-10-25 LAB — COMPREHENSIVE METABOLIC PANEL
ALT: 20 U/L (ref 0–35)
AST: 24 U/L (ref 0–37)
Albumin: 4.2 g/dL (ref 3.5–5.2)
Alkaline Phosphatase: 73 U/L (ref 39–117)
BUN: 12 mg/dL (ref 6–23)
CO2: 29 mEq/L (ref 19–32)
Calcium: 9.4 mg/dL (ref 8.4–10.5)
Chloride: 95 mEq/L — ABNORMAL LOW (ref 96–112)
Creatinine, Ser: 0.89 mg/dL (ref 0.40–1.20)
GFR: 77.42 mL/min (ref >60.00)
Glucose, Bld: 88 mg/dL (ref 70–99)
Potassium: 3.8 mEq/L (ref 3.5–5.1)
Sodium: 135 mEq/L (ref 135–145)
Total Bilirubin: 0.3 mg/dL (ref 0.2–1.2)
Total Protein: 8.5 g/dL — ABNORMAL HIGH (ref 6.0–8.3)

## 2021-10-25 LAB — CBC WITH DIFFERENTIAL/PLATELET
Basophils Absolute: 0.1 10*3/uL (ref 0.0–0.1)
Basophils Relative: 0.7 % (ref 0.0–3.0)
Eosinophils Absolute: 0.2 10*3/uL (ref 0.0–0.7)
Eosinophils Relative: 1.8 % (ref 0.0–5.0)
HCT: 35.5 % — ABNORMAL LOW (ref 36.0–46.0)
Hemoglobin: 11.5 g/dL — ABNORMAL LOW (ref 12.0–15.0)
Lymphocytes Relative: 31.7 % (ref 12.0–46.0)
Lymphs Abs: 3 10*3/uL (ref 0.7–4.0)
MCHC: 32.4 g/dL (ref 30.0–36.0)
MCV: 85.8 fl (ref 78.0–100.0)
Monocytes Absolute: 0.7 10*3/uL (ref 0.1–1.0)
Monocytes Relative: 7.6 % (ref 3.0–12.0)
Neutro Abs: 5.6 10*3/uL (ref 1.4–7.7)
Neutrophils Relative %: 58.2 % (ref 43.0–77.0)
Platelets: 405 10*3/uL — ABNORMAL HIGH (ref 150.0–400.0)
RBC: 4.13 Mil/uL (ref 3.87–5.11)
RDW: 18.8 % — ABNORMAL HIGH (ref 11.5–15.5)
WBC: 9.6 10*3/uL (ref 4.0–10.5)

## 2021-10-29 ENCOUNTER — Other Ambulatory Visit: Payer: Self-pay | Admitting: Internal Medicine

## 2021-10-29 DIAGNOSIS — D509 Iron deficiency anemia, unspecified: Secondary | ICD-10-CM

## 2021-10-31 ENCOUNTER — Encounter: Payer: Self-pay | Admitting: Internal Medicine

## 2021-11-01 NOTE — Telephone Encounter (Signed)
Patient has an appointment 11am on Tuesday Sept 5th. Patient is aware

## 2021-11-05 ENCOUNTER — Ambulatory Visit (HOSPITAL_BASED_OUTPATIENT_CLINIC_OR_DEPARTMENT_OTHER)
Admission: RE | Admit: 2021-11-05 | Discharge: 2021-11-05 | Disposition: A | Discharge status: Home / Self Care | Payer: 59 | Source: Ambulatory Visit | Attending: Internal Medicine | Admitting: Internal Medicine

## 2021-11-05 DIAGNOSIS — R1011 Right upper quadrant pain: Secondary | ICD-10-CM | POA: Diagnosis not present

## 2021-11-13 ENCOUNTER — Ambulatory Visit (INDEPENDENT_AMBULATORY_CARE_PROVIDER_SITE_OTHER): Payer: 59 | Admitting: Internal Medicine

## 2021-11-13 ENCOUNTER — Encounter: Payer: Self-pay | Admitting: Internal Medicine

## 2021-11-13 VITALS — BP 130/86 | HR 56 | Temp 98.4°F | Ht 66.0 in | Wt 215.4 lb

## 2021-11-13 DIAGNOSIS — D509 Iron deficiency anemia, unspecified: Secondary | ICD-10-CM

## 2021-11-13 DIAGNOSIS — M069 Rheumatoid arthritis, unspecified: Secondary | ICD-10-CM | POA: Diagnosis not present

## 2021-11-13 DIAGNOSIS — I10 Essential (primary) hypertension: Secondary | ICD-10-CM

## 2021-11-13 DIAGNOSIS — E559 Vitamin D deficiency, unspecified: Secondary | ICD-10-CM | POA: Diagnosis not present

## 2021-11-13 DIAGNOSIS — Z0000 Encounter for general adult medical examination without abnormal findings: Secondary | ICD-10-CM

## 2021-11-13 DIAGNOSIS — K5180 Other ulcerative colitis without complications: Secondary | ICD-10-CM

## 2021-11-13 DIAGNOSIS — E538 Deficiency of other specified B group vitamins: Secondary | ICD-10-CM | POA: Diagnosis not present

## 2021-11-13 LAB — CBC WITH DIFFERENTIAL/PLATELET
Basophils Absolute: 0.1 10*3/uL (ref 0.0–0.1)
Basophils Relative: 1.1 % (ref 0.0–3.0)
Eosinophils Absolute: 0.1 10*3/uL (ref 0.0–0.7)
Eosinophils Relative: 1.1 % (ref 0.0–5.0)
HCT: 34.4 % — ABNORMAL LOW (ref 36.0–46.0)
Hemoglobin: 11.3 g/dL — ABNORMAL LOW (ref 12.0–15.0)
Lymphocytes Relative: 30.4 % (ref 12.0–46.0)
Lymphs Abs: 2.2 10*3/uL (ref 0.7–4.0)
MCHC: 32.9 g/dL (ref 30.0–36.0)
MCV: 84.3 fl (ref 78.0–100.0)
Monocytes Absolute: 0.9 10*3/uL (ref 0.1–1.0)
Monocytes Relative: 12.4 % — ABNORMAL HIGH (ref 3.0–12.0)
Neutro Abs: 4 10*3/uL (ref 1.4–7.7)
Neutrophils Relative %: 55 % (ref 43.0–77.0)
Platelets: 343 10*3/uL (ref 150.0–400.0)
RBC: 4.08 Mil/uL (ref 3.87–5.11)
RDW: 17.6 % — ABNORMAL HIGH (ref 11.5–15.5)
WBC: 7.2 10*3/uL (ref 4.0–10.5)

## 2021-11-13 LAB — LIPID PANEL
Cholesterol: 174 mg/dL (ref 0–200)
HDL: 40.8 mg/dL (ref >39.00)
LDL Cholesterol: 106 mg/dL — ABNORMAL HIGH (ref 0–99)
NonHDL: 132.77
Total CHOL/HDL Ratio: 4
Triglycerides: 133 mg/dL (ref 0.0–149.0)
VLDL: 26.6 mg/dL (ref 0.0–40.0)

## 2021-11-13 LAB — COMPREHENSIVE METABOLIC PANEL
ALT: 16 U/L (ref 0–35)
AST: 19 U/L (ref 0–37)
Albumin: 3.8 g/dL (ref 3.5–5.2)
Alkaline Phosphatase: 65 U/L (ref 39–117)
BUN: 10 mg/dL (ref 6–23)
CO2: 30 mEq/L (ref 19–32)
Calcium: 9.1 mg/dL (ref 8.4–10.5)
Chloride: 97 mEq/L (ref 96–112)
Creatinine, Ser: 1.04 mg/dL (ref 0.40–1.20)
GFR: 64.2 mL/min (ref >60.00)
Glucose, Bld: 90 mg/dL (ref 70–99)
Potassium: 3.3 mEq/L — ABNORMAL LOW (ref 3.5–5.1)
Sodium: 136 mEq/L (ref 135–145)
Total Bilirubin: 0.3 mg/dL (ref 0.2–1.2)
Total Protein: 8.3 g/dL (ref 6.0–8.3)

## 2021-11-13 LAB — IBC + FERRITIN
Ferritin: 10.3 ng/mL (ref 10.0–291.0)
Iron: 18 ug/dL — ABNORMAL LOW (ref 42–145)
Saturation Ratios: 4.2 % — ABNORMAL LOW (ref 20.0–50.0)
TIBC: 425.6 ug/dL (ref 250.0–450.0)
Transferrin: 304 mg/dL (ref 212.0–360.0)

## 2021-11-13 LAB — HEMOGLOBIN A1C: Hgb A1c MFr Bld: 5.4 % (ref 4.6–6.5)

## 2021-11-13 LAB — VITAMIN D 25 HYDROXY (VIT D DEFICIENCY, FRACTURES): VITD: 59.82 ng/mL (ref 30.00–100.00)

## 2021-11-13 LAB — TSH: TSH: 1.37 u[IU]/mL (ref 0.35–5.50)

## 2021-11-13 LAB — VITAMIN B12: Vitamin B-12: >1500 pg/mL — ABNORMAL HIGH (ref 211–911)

## 2021-11-13 NOTE — Progress Notes (Signed)
Established Patient Office Visit     CC/Reason for Visit: Annual preventive exam  HPI: Meagan Grimes is a 46 y.o. female who is coming in today for the above mentioned reasons. Past Medical History is significant for: Hypertension, rheumatoid arthritis and ulcerative colitis.  She is overdue for eye and dental care.  She is due for flu and bivalent COVID vaccines.  She had a colonoscopy in 2022 and mammogram and Pap smear with her GYN earlier this year.   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 She has never used smokeless tobacco. She reports current alcohol use of about 5.0 standard drinks of alcohol per week. She reports that she does not use drugs.  Allergies: No Known Allergies  Family History:  Family History  Problem Relation Age of Onset   Hypertension Mother    Hypertension Father    Mental illness Father    Lung cancer Maternal Aunt    Lung cancer Maternal Grandmother    Hypertension Maternal Grandfather    Heart disease Maternal Grandfather    Heart disease Paternal Grandmother    Hypertension Paternal Grandmother    Colon cancer Neg Hx    Esophageal cancer Neg Hx    Rectal cancer Neg Hx    Liver cancer Neg Hx    Stomach cancer Neg Hx    Colon polyps Neg Hx      Current Outpatient Medications:    budesonide (ENTOCORT EC) 3 MG 24 hr capsule, TAKE 3 CAPSULES (9 MG TOTAL) BY MOUTH DAILY., Disp: 270 capsule, Rfl: 1   clobetasol ointment (TEMOVATE) 3.29 %, Apply 1 application topically as needed., Disp: , Rfl:    cyanocobalamin 1000 MCG tablet, Take 1,000 mcg by mouth daily., Disp: , Rfl:    dicyclomine (BENTYL) 20 MG tablet,  TAKE 1 TABLET (20 MG TOTAL) BY MOUTH EVERY 6 (SIX) HOURS AS NEEDED FOR SPASMS., Disp: 360 tablet, Rfl: 1   HUMIRA PEN 40 MG/0.4ML PNKT, INJECT 40MG INTO THE SKIN ONCE A WEEK, Disp: 4 each, Rfl: 5   hydrochlorothiazide (HYDRODIURIL) 25 MG tablet, TAKE ONE TABLET BY MOUTH DAILY, Disp: 90 tablet, Rfl: 1   Omega-3 Fatty Acids (FISH OIL ADULT GUMMIES PO), Take by mouth., Disp: , Rfl:    omeprazole (PRILOSEC OTC) 20 MG tablet, Take 20 mg by mouth daily., Disp: , Rfl:    VITAMIN D, ERGOCALCIFEROL, PO, Take 1,000 Units by mouth., Disp: , Rfl:   Review of Systems:  Constitutional: Denies fever, chills, diaphoresis, appetite change and fatigue.  HEENT: Denies photophobia, eye pain, redness, hearing loss, ear pain, congestion, sore throat, rhinorrhea, sneezing, mouth sores, trouble swallowing, neck pain, neck stiffness and tinnitus.   Respiratory: Denies SOB, DOE, cough, chest tightness,  and wheezing.   Cardiovascular: Denies chest pain, palpitations and leg swelling.  Gastrointestinal: Denies nausea, vomiting, abdominal pain, diarrhea, constipation, blood in stool and abdominal distention.  Genitourinary: Denies dysuria, urgency, frequency, hematuria, flank pain and difficulty urinating.  Endocrine: Denies: hot or cold intolerance, sweats, changes in hair or nails, polyuria, polydipsia. Musculoskeletal: Denies myalgias, back pain,  joint swelling, arthralgias and gait problem.  Skin: Denies pallor, rash and wound.  Neurological: Denies dizziness, seizures, syncope, weakness, light-headedness, numbness and headaches.  Hematological: Denies adenopathy. Easy bruising, personal or family bleeding history  Psychiatric/Behavioral: Denies suicidal ideation, mood changes, confusion, nervousness, sleep disturbance and agitation    Physical Exam: Vitals:   11/13/21 0744  BP: 130/86  Pulse: (!) 56  Temp: 98.4 F (36.9 C)  TempSrc: Oral  SpO2: 96%  Weight: 215 lb 6.4 oz (97.7 kg)  Height: 5' 6"  (1.676 m)     Body mass index is 34.77 kg/m.   Constitutional: NAD, calm, comfortable Eyes: PERRL, lids and conjunctivae normal ENMT: Mucous membranes are moist. Posterior pharynx clear of any exudate or lesions. Normal dentition. Tympanic membrane is pearly white, no erythema or bulging. Neck: normal, supple, no masses, no thyromegaly Respiratory: clear to auscultation bilaterally, no wheezing, no crackles. Normal respiratory effort. No accessory muscle use.  Cardiovascular: Regular rate and rhythm, no murmurs / rubs / gallops. No extremity edema. 2+ pedal pulses. No carotid bruits.  Abdomen: no tenderness, no masses palpated. No hepatosplenomegaly. Bowel sounds positive.  Musculoskeletal: no clubbing / cyanosis. No joint deformity upper and lower extremities. Good ROM, no contractures. Normal muscle tone.  Skin: no rashes, lesions, ulcers. No induration Neurologic: CN 2-12 grossly intact. Sensation intact, DTR normal. Strength 5/5 in all 4.  Psychiatric: Normal judgment and insight. Alert and oriented x 3. Normal mood.    [place] Office Visit from 11/13/2021 in [facility] at [facility]  PHQ-9 Total Score 2        Impression and Plan:  Encounter for preventive health examination  Vitamin B12 deficiency - Plan: Vitamin B12, Vitamin B12  Vitamin D deficiency - Plan: VITAMIN D 25 Hydroxy (Vit-D Deficiency, Fractures), VITAMIN D 25 Hydroxy (Vit-D Deficiency, Fractures)  Primary hypertension  Other ulcerative colitis without complication ([place])  Rheumatoid arthritis, involving unspecified site, unspecified whether rheumatoid factor present ([place]) - Plan: CBC with Differential/Platelet, Comprehensive metabolic panel, Hemoglobin A1c, Lipid panel, TSH, TSH, Lipid panel, Hemoglobin A1c, Comprehensive metabolic panel, CBC with Differential/Platelet  Iron deficiency anemia, unspecified iron deficiency anemia type - Plan: IBC + Ferritin  -Recommend routine eye and dental  care. -Immunizations: Due for flu and COVID, declines today despite counseling -Healthy lifestyle discussed in detail. -Labs to be updated today. -Colon cancer screening: 12/2020 -Breast cancer screening: 2023, obtain records -Cervical cancer screening: 2023, obtain records -Lung cancer screening: Not applicable -Prostate cancer screening: Not applicable -DEXA: Not applicable    Tiaria Biby Isaac Bliss, MD [facility]

## 2021-11-14 ENCOUNTER — Telehealth: Payer: Self-pay | Admitting: Pharmacy Technician

## 2021-11-14 ENCOUNTER — Other Ambulatory Visit (HOSPITAL_COMMUNITY): Payer: Self-pay

## 2021-11-14 NOTE — Telephone Encounter (Signed)
Patient Advocate Encounter  Received notification that prior authorization for Humira is required.   PA submitted on 11/14/21 Key [id number] Status is pending    Luciano Cutter, CPhT Patient Advocate Phone: [phone]

## 2021-11-15 ENCOUNTER — Encounter: Payer: Self-pay | Admitting: Internal Medicine

## 2021-11-18 ENCOUNTER — Other Ambulatory Visit: Payer: Self-pay | Admitting: Internal Medicine

## 2021-11-18 DIAGNOSIS — E876 Hypokalemia: Secondary | ICD-10-CM

## 2021-11-18 MED ORDER — POTASSIUM CHLORIDE CRYS ER 20 MEQ PO TBCR: 20.0000 meq | EXTENDED_RELEASE_TABLET | Freq: Two times a day (BID) | ORAL | 0 refills | Status: DC

## 2021-11-18 NOTE — Telephone Encounter (Signed)
Patient Advocate Encounter  Prior Authorization for Humira Pen (CF) 40MG/0.4ML pen-injector kit has been approved.     Effective: 11-18-2021 to 11-18-2022

## 2021-11-19 ENCOUNTER — Encounter: Payer: Self-pay | Admitting: Internal Medicine

## 2021-11-21 ENCOUNTER — Other Ambulatory Visit (HOSPITAL_COMMUNITY): Payer: Self-pay

## 2021-12-01 ENCOUNTER — Other Ambulatory Visit: Payer: Self-pay | Admitting: Internal Medicine

## 2021-12-01 DIAGNOSIS — I10 Essential (primary) hypertension: Secondary | ICD-10-CM

## 2021-12-04 ENCOUNTER — Other Ambulatory Visit: Payer: Self-pay | Admitting: Gastroenterology

## 2021-12-09 ENCOUNTER — Ambulatory Visit (AMBULATORY_SURGERY_CENTER): Payer: Self-pay | Admitting: *Deleted

## 2021-12-09 ENCOUNTER — Other Ambulatory Visit: Payer: Self-pay

## 2021-12-09 ENCOUNTER — Encounter: Payer: Self-pay | Admitting: Gastroenterology

## 2021-12-09 VITALS — Ht 66.0 in | Wt 211.0 lb

## 2021-12-09 DIAGNOSIS — K51919 Ulcerative colitis, unspecified with unspecified complications: Secondary | ICD-10-CM

## 2021-12-09 MED ORDER — NA SULFATE-K SULFATE-MG SULF 17.5-3.13-1.6 GM/177ML PO SOLN: 1.0000 | Freq: Once | ORAL | 0 refills | Status: AC

## 2021-12-09 NOTE — Progress Notes (Signed)
Pre visit completed in person   No egg or soy allergy known to patient  No issues known to pt with past sedation with any surgeries or procedures Patient denies ever being told they had issues or difficulty with intubation  No FH of Malignant Hyperthermia Pt is not on diet pills Pt is not on  home 02  Pt is not on blood thinners  Pt denies issues with constipation  No A fib or A flutter  Pt instructed to use Singlecare.com or GoodRx for a price reduction on prep

## 2021-12-17 ENCOUNTER — Encounter: Payer: Self-pay | Admitting: Gastroenterology

## 2021-12-17 ENCOUNTER — Ambulatory Visit (AMBULATORY_SURGERY_CENTER): Payer: 59 | Admitting: Gastroenterology

## 2021-12-17 ENCOUNTER — Other Ambulatory Visit: Payer: 59

## 2021-12-17 VITALS — BP 155/89 | HR 83 | Temp 98.6°F | Resp 13 | Ht 65.5 in | Wt 211.0 lb

## 2021-12-17 DIAGNOSIS — K51919 Ulcerative colitis, unspecified with unspecified complications: Secondary | ICD-10-CM

## 2021-12-17 DIAGNOSIS — K51011 Ulcerative (chronic) pancolitis with rectal bleeding: Secondary | ICD-10-CM

## 2021-12-17 DIAGNOSIS — K5000 Crohn's disease of small intestine without complications: Secondary | ICD-10-CM

## 2021-12-17 DIAGNOSIS — K649 Unspecified hemorrhoids: Secondary | ICD-10-CM | POA: Diagnosis not present

## 2021-12-17 DIAGNOSIS — K630 Abscess of intestine: Secondary | ICD-10-CM

## 2021-12-17 MED ORDER — SODIUM CHLORIDE 0.9 % IV SOLN: 500.0000 mL | Freq: Once | INTRAVENOUS | Status: DC

## 2021-12-17 MED ORDER — MESALAMINE 1000 MG RE SUPP: 1000.0000 mg | Freq: Every day | RECTAL | 0 refills | Status: DC

## 2021-12-17 NOTE — Progress Notes (Signed)
Called to room to assist during endoscopic procedure.  Patient ID and intended procedure confirmed with present staff. Received instructions for my participation in the procedure from the performing physician.  

## 2021-12-17 NOTE — Progress Notes (Unsigned)
[facility] History and Physical   Primary Care Physician:  Isaac Bliss, Rayford Halsted, MD   Reason for Procedure:  Ulcerative colitis  Plan:     colonoscopy with possible interventions as needed     HPI: Meagan Grimes is a very pleasant 46 y.o. female here for colonoscopy to assess disease activity and response to treatment with ulcerative colitis.   The risks and benefits as well as alternatives of endoscopic procedure(s) have been discussed and reviewed. All questions answered. The patient agrees to proceed.    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 Authorizing Provider  ALPRAZolam Duanne Moron) 0.5 MG tablet Take 0.5 mg by mouth daily. 09/11/21  Yes [provider]  dicyclomine (BENTYL) 20 MG tablet TAKE 1 TABLET (20 MG TOTAL) BY MOUTH EVERY 6 (SIX) HOURS AS NEEDED FOR SPASMS. 01/21/21  Yes Terrye Dombrosky, Venia Minks, MD  HUMIRA PEN 40 MG/0.4ML PNKT INJECT 40MG INTO THE SKIN ONCE A WEEK 12/04/21  Yes Kolleen Ochsner, Venia Minks, MD  hydrochlorothiazide (HYDRODIURIL) 25 MG tablet TAKE ONE TABLET BY MOUTH DAILY 12/02/21  Yes Isaac Bliss, Rayford Halsted, MD  Omega-3 Fatty Acids (FISH OIL ADULT GUMMIES PO) Take by mouth.   Yes [provider]  VITAMIN D, ERGOCALCIFEROL, PO Take 1,000 Units by mouth.   Yes [provider]  budesonide (ENTOCORT EC) 3 MG 24 hr capsule TAKE 3 CAPSULES (9 MG TOTAL) BY MOUTH DAILY. Patient not taking: Reported on 12/09/2021 03/25/21   Mauri Pole, MD  clobetasol ointment (TEMOVATE) 0.93 % Apply 1 application topically as needed. Patient not taking: Reported on 12/17/2021    [provider]   omeprazole (PRILOSEC OTC) 20 MG tablet Take 20 mg by mouth daily. Patient not taking: Reported on 12/09/2021    [provider]  valACYclovir (VALTREX) 500 MG tablet Take 500 mg by mouth daily. Patient not taking: Reported on 12/17/2021 10/27/21   [provider]    Current Outpatient Medications  Medication Sig Dispense Refill   ALPRAZolam (XANAX) 0.5 MG tablet Take 0.5 mg by mouth daily.     dicyclomine (BENTYL) 20 MG tablet TAKE 1 TABLET (20 MG TOTAL) BY MOUTH EVERY 6 (SIX) HOURS AS NEEDED FOR SPASMS. 360 tablet 1   HUMIRA PEN 40 MG/0.4ML PNKT INJECT 40MG INTO THE SKIN ONCE A WEEK 4 each 5   hydrochlorothiazide (HYDRODIURIL) 25 MG tablet TAKE ONE TABLET BY MOUTH DAILY 90 tablet 1   Omega-3 Fatty Acids (FISH OIL ADULT GUMMIES PO) Take by mouth.     VITAMIN D, ERGOCALCIFEROL, PO Take 1,000 Units by mouth.     budesonide (ENTOCORT EC) 3 MG 24 hr capsule TAKE 3 CAPSULES (9 MG TOTAL) BY MOUTH DAILY. (Patient not taking: Reported on 12/09/2021) 270 capsule 1   clobetasol ointment (TEMOVATE) 8.18 % Apply 1 application topically as needed. (Patient not taking: Reported on 12/17/2021)     omeprazole (PRILOSEC OTC) 20 MG tablet Take 20 mg by mouth daily. (Patient not taking: Reported on 12/09/2021)     valACYclovir (VALTREX) 500 MG tablet Take 500 mg by mouth daily. (  Patient not taking: Reported on 12/17/2021)     Current Facility-Administered Medications  Medication Dose Route Frequency Provider Last Rate Last Admin   0.9 %  sodium chloride infusion  500 mL Intravenous Once Mauri Pole, MD        Allergies as of 12/17/2021 - Review Complete 12/17/2021  Allergen Reaction Noted   No known allergies  12/17/2021    Family History  Problem Relation Age of Onset   Hypertension Mother    Hypertension Father    Mental illness Father    Lung cancer Maternal Aunt    Lung cancer Maternal Grandmother    Hypertension Maternal Grandfather    Heart disease Maternal Grandfather     Heart disease Paternal Grandmother    Hypertension Paternal Grandmother    Colon cancer Neg Hx    Esophageal cancer Neg Hx    Rectal cancer Neg Hx    Liver cancer Neg Hx    Stomach cancer Neg Hx    Colon polyps Neg Hx     Social History   Socioeconomic History   Marital status: Married    Spouse name: Not on file   Number of children: 3   Years of education: Not on file   Highest education level: Master's degree (e.g., MA, MS, MEng, MEd, MSW, MBA)  Occupational History   Occupation: IT trainer  Tobacco Use   Smoking status: Former   Smokeless tobacco: Never  Scientific laboratory technician Use: Never used  Substance and Sexual Activity   Alcohol use: Yes    Alcohol/week: 5.0 standard drinks of alcohol    Types: 5 Glasses of wine per week   Drug use: No   Sexual activity: Yes    Partners: Male    Birth control/protection: None  Other Topics Concern   Not on file  Social History Narrative   Not on file   Social Determinants of Health   Financial Resource Strain: Low Risk  (10/23/2021)   Overall Financial Resource Strain (CARDIA)    Difficulty of Paying Living Expenses: Not hard at all  Food Insecurity: No Food Insecurity (10/23/2021)   Hunger Vital Sign    Worried About Running Out of Food in the Last Year: Never true    [place] in the Last Year: Never true  Transportation Needs: No Transportation Needs (10/23/2021)   PRAPARE - Hydrologist (Medical): No    Lack of Transportation (Non-Medical): No  Physical Activity: Insufficiently Active (10/23/2021)   Exercise Vital Sign    Days of Exercise per Week: 2 days    Minutes of Exercise per Session: 30 min  Stress: Stress Concern Present (10/23/2021)   [place]    Feeling of Stress : Very much  Social Connections: Moderately Integrated (10/23/2021)   Social Connection and Isolation Panel [NHANES]    Frequency of  Communication with Friends and Family: Twice a week    Frequency of Social Gatherings with Friends and Family: Three times a week    Attends Religious Services: 1 to 4 times per year    Active Member of Clubs or Organizations: No    Attends Music therapist: Not on file    Marital Status: Married  Human resources officer Violence: Not on file    Review of Systems:  All other review of systems negative except as mentioned in the HPI.  Physical Exam: Vital signs in last 24 hours:  Blood Pressure (Abnormal) 160/89 (BP Location: Right Arm, Patient Position: Sitting, Cuff Size: Normal)   Pulse 92   Temperature 98.6 F (37 C) (Temporal)   Height 5' 5.5" (1.664 m)   Weight 211 lb (95.7 kg)   Oxygen Saturation 98%   Body Mass Index 34.58 kg/m  General:   Alert, NAD Lungs:  Clear .   Heart:  Regular rate and rhythm Abdomen:  Soft, nontender and nondistended. Neuro/Psych:  Alert and cooperative. Normal mood and affect. A and O x 3  Reviewed labs, radiology imaging, old records and pertinent past GI work up  Patient is appropriate for planned procedure(s) and anesthesia in an ambulatory setting   K. Denzil Magnuson , MD [phone]

## 2021-12-17 NOTE — Progress Notes (Unsigned)
Vitals-DT  Pt's states no medical or surgical changes since previsit or office visit.

## 2021-12-17 NOTE — Op Note (Signed)
[facility] Patient Name: Meagan Grimes Procedure Date: 12/17/2021 10:08 AM MRN: [id number] Endoscopist: Mauri Pole , MD Age: 46 Referring MD:  Date of Birth: September 25, 1975 Gender: Female Account #: [id number] Procedure:                Colonoscopy Indications:              Disease activity assessment of chronic ulcerative                            pancolitis, Assess therapeutic response to therapy                            of chronic ulcerative pancolitis Medicines:                Monitored Anesthesia Care Procedure:                Pre-Anesthesia Assessment:                           - Prior to the procedure, a History and Physical                            was performed, and patient medications and                            allergies were reviewed. The patient's tolerance of                            previous anesthesia was also reviewed. The risks                            and benefits of the procedure and the sedation                            options and risks were discussed with the patient.                            All questions were answered, and informed consent                            was obtained. Prior Anticoagulants: The patient has                            taken no previous anticoagulant or antiplatelet                            agents. ASA Grade Assessment: II - A patient with                            mild systemic disease. After reviewing the risks                            and benefits, the patient was deemed in  satisfactory condition to undergo the procedure.                           After obtaining informed consent, the colonoscope                            was passed under direct vision. Throughout the                            procedure, the patient's blood pressure, pulse, and                            oxygen saturations were monitored continuously. The                            Olympus Scope  [id number] was introduced through the                            anus and advanced to the the terminal ileum, with                            identification of the appendiceal orifice and IC                            valve. The colonoscopy was performed without                            difficulty. The patient tolerated the procedure                            well. The quality of the bowel preparation was                            good. The terminal ileum, ileocecal valve,                            appendiceal orifice, and rectum were photographed. Scope In: 10:23:03 AM Scope Out: 10:36:02 AM Scope Withdrawal Time: 0 hours 8 minutes 59 seconds  Total Procedure Duration: 0 hours 12 minutes 59 seconds  Findings:                 The perianal exam findings include non-thrombosed                            external hemorrhoids and hypertrophied anal                            papilla(e).                           Inflammation was found in a continuous and                            circumferential pattern from the anus to the cecum.  This was graded as Mayo Score 2 (moderate, with                            marked erythema, absent vascular pattern,                            friability, erosions), and when compared to the                            previous examination, the findings are improved.                            Biopsies were taken with a cold forceps for                            histology.                           The terminal ileum appeared normal. Biopsies were                            taken with a cold forceps for histology.                           Non-bleeding external and internal hemorrhoids were                            found during retroflexion. The hemorrhoids were                            large. Complications:            No immediate complications. Estimated Blood Loss:     Estimated blood loss was minimal. Impression:                - Non-thrombosed external hemorrhoids and                            hypertrophied anal papilla(e) found on perianal                            exam.                           - Moderately active (Mayo Score 2) pancolitis                            ulcerative colitis, improved since the last                            examination. Biopsied.                           - The examined portion of the ileum was normal.                            Biopsied.                           -  Non-bleeding external and internal hemorrhoids. Recommendation:           - Patient has a contact number available for                            emergencies. The signs and symptoms of potential                            delayed complications were discussed with the                            patient. Return to normal activities tomorrow.                            Written discharge instructions were provided to the                            patient.                           - Resume previous diet.                           - Continue present medications.                           - Await pathology results. Will request insurnace                            approval to switch to alternate biologic or                            increase Humira dose. Check Ab and drug trough for                            Humira                           - Repeat colonoscopy in 1 year for surveillance                            based on pathology results.                           - Use Canasa 1000 mg suppository 1 per rectum QHS                            for 4 weeks.                           - Follow up office visit in 3-4 weeks with Dr                            Silverio Decamp or APP Mauri Pole, MD 12/17/2021 10:45:19 AM This report has been signed electronically.

## 2021-12-17 NOTE — Patient Instructions (Signed)
Please read handouts provided. Continue present medications. Await pathology results. Repeat colonoscopy in 1 year. Use Canasa 1051m suppository 1 per rectum every night for 4 weeks. Follow up office visit in 3-4 weeks with Dr. NSilverio Decampor APP.  [facility]   Refer to the procedure report that was given to you for any specific questions about what was found during the examination.  If the procedure report does not answer your questions, please call your gastroenterologist to clarify.  If you requested that your care partner not be given the details of your procedure findings, then the procedure report has been included in a sealed envelope for you to review at your convenience later.  YOU SHOULD EXPECT: Some feelings of bloating in the abdomen. Passage of more gas than usual.  Walking can help get rid of the air that was put into your GI tract during the procedure and reduce the bloating. If you had a lower endoscopy (such as a colonoscopy or flexible sigmoidoscopy) you may notice spotting of blood in your stool or on the toilet paper. If you underwent a bowel prep for your procedure, you may not have a normal bowel movement for a few days.  Please Note:  You might notice some irritation and congestion in your nose or some drainage.  This is from the oxygen used during your procedure.  There is no need for concern and it should clear up in a day or so.  SYMPTOMS TO REPORT IMMEDIATELY:  Following lower endoscopy (colonoscopy or flexible sigmoidoscopy):  Excessive amounts of blood in the stool  Significant tenderness or worsening of abdominal pains  Swelling of the abdomen that is new, acute  Fever of 100F or higher.  For urgent or emergent issues, a gastroenterologist can be reached at any hour by calling ([phone] Do not use MyChart messaging for urgent concerns.    DIET:  We do recommend a small meal at first, but then you  may proceed to your regular diet.  Drink plenty of fluids but you should avoid alcoholic beverages for 24 hours.  ACTIVITY:  You should plan to take it easy for the rest of today and you should NOT DRIVE or use heavy machinery until tomorrow (because of the sedation medicines used during the test).    FOLLOW UP: Our staff will call the number listed on your records the next business day following your procedure.  We will call around 7:15- 8:00 am to check on you and address any questions or concerns that you may have regarding the information given to you following your procedure. If we do not reach you, we will leave a message.     If any biopsies were taken you will be contacted by phone or by letter within the next 1-3 weeks.  Please call [email or web address] ([phone] you have not heard about the biopsies in 3 weeks.    SIGNATURES/CONFIDENTIALITY: You and/or your care partner have signed paperwork which will be entered into your electronic medical record.  These signatures attest to the fact that that the information above on your After Visit Summary has been reviewed and is understood.  Full responsibility of the confidentiality of this discharge information lies with you and/or your care-partner.

## 2021-12-17 NOTE — Progress Notes (Unsigned)
Report given to PACU, vss 

## 2021-12-18 ENCOUNTER — Telehealth: Payer: Self-pay | Admitting: *Deleted

## 2021-12-18 NOTE — Telephone Encounter (Signed)
  Follow up Call-     12/17/2021    9:44 AM 12/17/2021    9:37 AM 12/20/2020    7:39 AM  Call back number  Post procedure Call Back phone  # [phone]  [phone]  Permission to leave phone message  Yes Yes     Patient questions:  Do you have a fever, pain , or abdominal swelling? No. Pain Score  0 *  Have you tolerated food without any problems? Yes.    Have you been able to return to your normal activities? Yes.    Do you have any questions about your discharge instructions: Diet   No. Medications  No. Follow up visit  No.  Do you have questions or concerns about your Care? No.  Actions: * If pain score is 4 or above: No action needed, pain <4.

## 2021-12-20 NOTE — Progress Notes (Unsigned)
12/23/2021 Meagan Grimes [id number] 1976/01/16  Referring provider: Isaac Bliss, [place]* Primary GI doctor: Dr. Silverio Decamp  ASSESSMENT AND PLAN:  46 year old female with history of psoriatic arthritis,  worsening UC symptoms, most recent colonoscopy Mayo score 2, while on Humira 40 mg Q weekly UC diagnosed 2018 after colonoscopy, no history of complications Patient was already on Humira every other week for psoriatic arthritis, increased to once weekly 12/2020 Colonoscopy 12/2021 Mayo score 2 Having 10-15 bowel movements daily, blood in the stool  Check Cdiff and GI pathogen panel to rule out infectious diarrhea.  Check inflammatory labs, Fecal calprotectin, CBC, CMET, hsCRP, sed rate. Will check Hep B and TB Gold in anticipation of potential need of change of medications.  Patient unable to tolerate Canasa suppositories, will send in Lialda 1.2 g take 2 times daily, will do hydrocortisone rectal cream and suppositories.  Patient has been on Stelara in the past for psoriatic arthritis without help.  On Humira once a week since 12/2020 without endoscopic remission or symptom management. Pending Humira antibody and level Pending stool studies for infection, if negative, this may be considered anti-TNF failure. Rinvoq may be a good option for the patient with her psoriatic arthritis and UC, will get lipid panel, instructed patient to go to [facility] [facility] or PCP to get Shingrix vaccination Final decision for alternative therapy or potentially increasing Humira dose will be left to Dr. Silverio Decamp, may want to consider discussing with Dr. Amil Amen as well.  Will schedule with Dr. Silverio Decamp first available appointment.  Recall colonoscopy 1 year  Vitamin B12 deficiency Continue treatment  Vitamin D deficiency Continue treatment   Psoriatic arthritis ([place]) Was on Humira every 2 weeks until 12/2020 and was increased to once weekly due to colonoscopy still showing  inflammation. Patient states she is also having joint pain and feels that her psoriatic arthritis is not well controlled. Follows with Dr. Amil Amen    History of Present Illness:  46 y.o. female presents for evaluation of ulcerative colitis. Last seen in the office on 01/31/2021.   IBD history (prior medications titers why stopped and current meds with last dose): Diagnosed 10/2016 after colonoscopy for diarrhea and rectal bleeding by Dr. Silverio Decamp which showed moderate information with ulcers in rectum, sigmoid and descending colon.  Right colon and terminal ileum normal. Patient was already on Humira for psoriatic arthritis every other week. Started on Lialda 4.8 daily and Uceris 9 mg daily at that time. 02/17/2018 worsening rectal bleeding and diarrhea with posterior fissure, treated nitroglycerin 09/20/2019 diarrhea, started on prednisone taper CT abdomen pelvis showed thickening of sigmoid colon with UC flare. 11/22/2020 adalimumab drug level and antibodies showed low trough at 2.4, no antibodies. 12/20/2020 colonoscopy for GI bleeding and UC showed continuous area of bleeding ulcerated mucosa rectum into the sigmoid and descending colon.  Pathology showed moderately active chronic colitis, negative dysplasia Increased Humira 40 mg subcutaneously every week and continued on budesonide 9 mg daily for 2 months. 12/17/2021 Colonoscopy Assess therapeutic response showed nonthrombosed external hemorrhoids, moderately active Mayo score 2 pan colitis ulcerative colitis, recall colonoscopy 1 year  Discussed switching to [employer] with insurance approval.  Patient also has psoriatic arthritis. Was on stelera for PsA 10 years ago without help of her arthritis.   Last colonoscopy: 12/20/2020, medication patient was on Humira every other week, increase to every week at that time.  Continued on budesonide 9 mg daily for 2 months. Last small bowel imaging: 09/29/2019 CT abdomen pelvis with contrast  thickening of sigmoid colon with UC flare Extraintestinal manifestations: Arthralgias with her psoriatic arthritis Surgical history: no surgery.  Other significant medical history: Patient is on oral B12 Patient has psoriatic arthritis follows with Dr.Beekman Patient has vitamin D deficiency  Current History The patient has 10- 15 bowel movements per day which are  watery with blood, BRB with severe hemorrhoids. Unable to do the mesalamine suppository at this time.  The patient admits to abdominal pain, located in the lower abdomen. The pain is described as cramping. Takes bentyl that helps, taking twice a day.  The patient does not have fever.   The patient does  have weight loss, about 13 lbs in 3 weeks.   Recent labs: 11/01/2020 CRP 11.4  01/31/2021 SED RATE 51 Fecal cal never done 11/13/2021 WBC 7.2 HGB 11.3 MCV 84.3 Platelets 343.0 11/13/2021 Iron 18 Ferritin 10.3  B12 >1500 11/13/2021 AST 19 ALT 16 Alkphos 65 TBili 0.3 11/13/2021 VITAMIN D 59.82  TB GOLD 12/11/2020 NEGATIVE or TB skin if indeterminate.  HepBsAG No results found TPMT Activity: No results found .  IBD Health Care Maintenance: Annual Flu Vaccine -  UTD Pneumococcal Vaccine if receiving immunosuppression: -  Suggest getting TB testing if on anti-TNF, yearly - will get COVID vaccine UTD Shingrix suggest getting  Immunization History  Administered Date(s) Administered   Influenza-Unspecified 11/02/2018   PFIZER Comirnaty(Gray Top)Covid-19 Tri-Sucrose Vaccine 08/10/2020   PFIZER(Purple Top)SARS-COV-2 Vaccination 05/22/2019, 06/12/2019, 12/30/2019   Tdap 09/29/2016     She  reports that she has quit smoking. She has never used smokeless tobacco. She reports current alcohol use of about 5.0 standard drinks of alcohol per week. She reports that she does not use drugs. Her family history includes Heart disease in her maternal grandfather and paternal grandmother; Hypertension in her father, maternal grandfather,  mother, and paternal grandmother; Lung cancer in her maternal aunt and maternal grandmother; Mental illness in her father.   Current Medications:   Current Outpatient Medications (Endocrine & Metabolic):    budesonide (ENTOCORT EC) 3 MG 24 hr capsule, TAKE 3 CAPSULES (9 MG TOTAL) BY MOUTH DAILY. (Patient not taking: Reported on 12/09/2021)  Current Outpatient Medications (Cardiovascular):    hydrochlorothiazide (HYDRODIURIL) 25 MG tablet, TAKE ONE TABLET BY MOUTH DAILY   Current Outpatient Medications (Analgesics):    HUMIRA PEN 40 MG/0.4ML PNKT, INJECT 40MG INTO THE SKIN ONCE A WEEK   Current Outpatient Medications (Other):    ALPRAZolam (XANAX) 0.5 MG tablet, Take 0.5 mg by mouth daily.   dicyclomine (BENTYL) 20 MG tablet, TAKE 1 TABLET (20 MG TOTAL) BY MOUTH EVERY 6 (SIX) HOURS AS NEEDED FOR SPASMS.   hydrocortisone (ANUSOL-HC) 2.5 % rectal cream, Place 1 Application rectally 2 (two) times daily.   hydrocortisone (ANUSOL-HC) 25 MG suppository, Place 1 suppository (25 mg total) rectally 2 (two) times daily.   mesalamine (LIALDA) 1.2 g EC tablet, Take 2 tablets (2.4 g total) by mouth daily with breakfast.   Omega-3 Fatty Acids (FISH OIL ADULT GUMMIES PO), Take by mouth.   VITAMIN D, ERGOCALCIFEROL, PO, Take 1,000 Units by mouth.   clobetasol ointment (TEMOVATE) 6.28 %, Apply 1 application topically as needed. (Patient not taking: Reported on 12/17/2021)   omeprazole (PRILOSEC OTC) 20 MG tablet, Take 20 mg by mouth daily. (Patient not taking: Reported on 12/09/2021)   valACYclovir (VALTREX) 500 MG tablet, Take 500 mg by mouth daily. (Patient not taking: Reported on 12/17/2021)  Surgical History:  She  has a past surgical history that includes  Cesarean section; Laser ablation; and Colonoscopy.  Current Medications, Allergies, Past Medical History, Past Surgical History, Family History and Social History were reviewed in [employer] record.  Physical Exam: BP  138/74   Pulse 93   Ht 5' 5"  (1.651 m)   Wt 205 lb (93 kg)   BMI 34.11 kg/m  General:   Pleasant, well developed female in no acute distress Heart : Regular rate and rhythm; no murmurs Pulm: Clear anteriorly; no wheezing Abdomen:  Soft, Obese AB, Active bowel sounds. mild tenderness in the RUQ and in the lower abdomen. Without guarding and Without rebound, No organomegaly appreciated. Rectal: Not evaluated Extremities:  without  edema. Neurologic:  Alert and  oriented x4;  No focal deficits.  Psych:  Cooperative. Normal mood and affect.   Vladimir Crofts, PA-C 12/23/21

## 2021-12-23 ENCOUNTER — Other Ambulatory Visit: Payer: Self-pay

## 2021-12-23 ENCOUNTER — Encounter: Payer: Self-pay | Admitting: Physician Assistant

## 2021-12-23 ENCOUNTER — Other Ambulatory Visit: Payer: Self-pay | Admitting: Physician Assistant

## 2021-12-23 ENCOUNTER — Ambulatory Visit: Payer: 59 | Admitting: Physician Assistant

## 2021-12-23 ENCOUNTER — Other Ambulatory Visit (INDEPENDENT_AMBULATORY_CARE_PROVIDER_SITE_OTHER): Payer: 59

## 2021-12-23 VITALS — BP 138/74 | HR 93 | Ht 65.0 in | Wt 205.0 lb

## 2021-12-23 DIAGNOSIS — K5180 Other ulcerative colitis without complications: Secondary | ICD-10-CM | POA: Diagnosis not present

## 2021-12-23 DIAGNOSIS — Z79899 Other long term (current) drug therapy: Secondary | ICD-10-CM

## 2021-12-23 DIAGNOSIS — E538 Deficiency of other specified B group vitamins: Secondary | ICD-10-CM | POA: Diagnosis not present

## 2021-12-23 DIAGNOSIS — E559 Vitamin D deficiency, unspecified: Secondary | ICD-10-CM

## 2021-12-23 DIAGNOSIS — K625 Hemorrhage of anus and rectum: Secondary | ICD-10-CM

## 2021-12-23 DIAGNOSIS — L4050 Arthropathic psoriasis, unspecified: Secondary | ICD-10-CM | POA: Diagnosis not present

## 2021-12-23 LAB — CBC WITH DIFFERENTIAL/PLATELET
Basophils Absolute: 0 10*3/uL (ref 0.0–0.1)
Basophils Relative: 0.3 % (ref 0.0–3.0)
Eosinophils Absolute: 0.1 10*3/uL (ref 0.0–0.7)
Eosinophils Relative: 1.2 % (ref 0.0–5.0)
HCT: 33.9 % — ABNORMAL LOW (ref 36.0–46.0)
Hemoglobin: 10.7 g/dL — ABNORMAL LOW (ref 12.0–15.0)
Lymphocytes Relative: 27.4 % (ref 12.0–46.0)
Lymphs Abs: 2.7 10*3/uL (ref 0.7–4.0)
MCHC: 31.7 g/dL (ref 30.0–36.0)
MCV: 79.8 fl (ref 78.0–100.0)
Monocytes Absolute: 0.8 10*3/uL (ref 0.1–1.0)
Monocytes Relative: 8.4 % (ref 3.0–12.0)
Neutro Abs: 6.1 10*3/uL (ref 1.4–7.7)
Neutrophils Relative %: 62.7 % (ref 43.0–77.0)
Platelets: 480 10*3/uL — ABNORMAL HIGH (ref 150.0–400.0)
RBC: 4.25 Mil/uL (ref 3.87–5.11)
RDW: 17.2 % — ABNORMAL HIGH (ref 11.5–15.5)
WBC: 9.8 10*3/uL (ref 4.0–10.5)

## 2021-12-23 LAB — LIPID PANEL
Cholesterol: 162 mg/dL (ref 0–200)
HDL: 32.3 mg/dL — ABNORMAL LOW (ref >39.00)
LDL Cholesterol: 90 mg/dL (ref 0–99)
NonHDL: 129.38
Total CHOL/HDL Ratio: 5
Triglycerides: 199 mg/dL — ABNORMAL HIGH (ref 0.0–149.0)
VLDL: 39.8 mg/dL (ref 0.0–40.0)

## 2021-12-23 LAB — BASIC METABOLIC PANEL
BUN: 9 mg/dL (ref 6–23)
CO2: 32 mEq/L (ref 19–32)
Calcium: 9.4 mg/dL (ref 8.4–10.5)
Chloride: 97 mEq/L (ref 96–112)
Creatinine, Ser: 0.97 mg/dL (ref 0.40–1.20)
GFR: 69.74 mL/min (ref >60.00)
Glucose, Bld: 99 mg/dL (ref 70–99)
Potassium: 3.2 mEq/L — ABNORMAL LOW (ref 3.5–5.1)
Sodium: 131 mEq/L — ABNORMAL LOW (ref 135–145)

## 2021-12-23 LAB — HEPATIC FUNCTION PANEL
ALT: 9 U/L (ref 0–35)
AST: 11 U/L (ref 0–37)
Albumin: 3.8 g/dL (ref 3.5–5.2)
Alkaline Phosphatase: 74 U/L (ref 39–117)
Bilirubin, Direct: 0 mg/dL (ref 0.0–0.3)
Total Bilirubin: 0.3 mg/dL (ref 0.2–1.2)
Total Protein: 8.5 g/dL — ABNORMAL HIGH (ref 6.0–8.3)

## 2021-12-23 LAB — SEDIMENTATION RATE: Sed Rate: 129 mm/hr — ABNORMAL HIGH (ref 0–20)

## 2021-12-23 LAB — HIGH SENSITIVITY CRP: CRP, High Sensitivity: 95.5 mg/L — ABNORMAL HIGH (ref 0.000–5.000)

## 2021-12-23 MED ORDER — MESALAMINE 1.2 G PO TBEC: 2.4000 g | DELAYED_RELEASE_TABLET | Freq: Every day | ORAL | 0 refills | Status: DC

## 2021-12-23 MED ORDER — HYDROCORTISONE ACETATE 25 MG RE SUPP: 25.0000 mg | Freq: Two times a day (BID) | RECTAL | 0 refills | Status: DC

## 2021-12-23 MED ORDER — HYDROCORTISONE ACETATE 25 MG RE SUPP: 25.0000 mg | Freq: Every day | RECTAL | 0 refills | Status: DC

## 2021-12-23 MED ORDER — HYDROCORTISONE (PERIANAL) 2.5 % EX CREA: 1.0000 | TOPICAL_CREAM | Freq: Two times a day (BID) | CUTANEOUS | 2 refills | Status: DC

## 2021-12-23 NOTE — Patient Instructions (Addendum)
Your provider has requested that you go to the basement level for lab work before leaving today. Press "B" on the elevator. The lab is located at the first door on the left as you exit the elevator.   GET SHINGLES VACCINE, WILL DISCUSS WITH DR Silverio Decamp ABOUT NEXT BEST MEDICATION WILL SCHEDULE NEXT OV WITH DR. [place] Maintenance in Inflammatory Bowel Disease  Vaccines Inflammatory Bowel Disease (IBD) is often treated with immunomodulatory medications (6-mercaptopurine, azathioprine, methotrexate), biologic therapies (adalimumab, certulizomab, natalizumab, infliximab, vedolizumab) or chronic steroids (at least 33m daily for 3 months), which suppress the immune system.  Patients receiving this type of therapy are at higher risk to develop infections.  Many infections can be prevented by vaccinations. Inflammatory bowel disease within itself can cause inflammation and weaken the immune system.  Discuss vaccinations with your physician, but in general, IBD patients SHOULD receive the following vaccines:  Shingles vaccination Patient is at increased risk for immune compromised such as those with inflammatory bowel disease are able to get the Shingrix vaccine age 4536and up. 2 part shot, second shot between second and 646-month If you go over 6 months for the second shot have to restart series. Shingles is herpes virus dormant along the nerve root, can cause deafness, blindness and lasting pain.  Hepatitis A and B -We can test for this and able to get this in our office.   Human papilloma virus (HPV)  The HPV vaccine is recommended in females aged 9-23-45It is a 2-dose vaccine.   The HPV vaccine is also now recommended in males aged 13-45  Influenza  All adults should get the influenza vaccine annually.  IBD patients on immunosuppressive therapy should NOT get the intranasal vaccine, as it is a live vaccine.  Pneumococcal  All adults 65 years or older should receive the  Pneumococcal Conjugate Vaccine (PCV13), followed by the Pneumococcal Polysaccharide Vaccine (PPSV23) at least 1 year later.  In addition, patients over the age of 1928n immunosuppressive therapy should receive the PCV13, followed by PPSV23 no sooner than 8 weeks later.  A booster PPSV23 is also recommended 5 years later.  Finally, patients that smoke should get the PPSV23 if not otherwise vaccinated.  Tetanus, diphtheria, pertussis (Tdap/Td)  All adults should receive a 1-time Tdap.  A Td booster should be administered every 10 years.   Bone Health Patients with IBD can have markedly lower bone mineral densities than patients without IBD. Low bone mineral density is associated with fractures. Therefore, screening for bone disease, such as osteoporosis and osteopenia, is important in certain populations.  The American Gastroenterology Association (AGA) and the AmEnglishtownf Gastro[facility] (APark [facility] Ltdrecommend dual-energy x-ray absorptiometry scanning (DEXA) in postmenopausal women, men over the age of 5058patients with prolonged corticosteroid use (greater than 3 consecutive months or recurrent courses), patients with a personal history of low trauma fracture and patients with hypogonadism.   Tobacco Cessation Smoking worsens Crohn's disease. In addition, smoking is associated with many known cardiac, pulmonary and oncologic risks.   If you need help with quitting smoking, please talk to your doctor and call 1-800-QUIT NOW.  Cancer Screening IBD patients on immunosuppressive therapy may be more susceptible to certain types of cancer. However, specific evidence is conflicting. Therefore, patients with IBD should undergo age- and sex-specific cancer screening. Please discuss this important issue with your primary care provider.  Colon Cancer  Patients with either Ulcerative Colitis (UC) or Crohn's disease involving the colon should have colon  cancer screening 8-10 years after initial diagnosis.  After that time, the frequency of surveillance colonocoscopy will be determined by your gastroenterologist, but will usually occur every 1-2 years.   Patients with IBD and Primary Sclerosing Cholangitis ([place]) need a screening colonoscopy at the time of diagnosis of [place] and annually thereafter.   References: Itzkowitz SH, Present DH. Consensus Conference: Colorectal Cancer Screening and Surveillance in Inflammatory Bowel Disease. Inflamm Bowel Dis 2005;11:314-321.   Moscandrew M, Mahadevan U, Kane S. General Health Maintenance in IBD. Inflamm Bowel Dis 2009;15 [id number].  Thank you for entrusting me with your care and for choosing [facility], Vicie Mutters, P.A.-C

## 2021-12-23 NOTE — Progress Notes (Signed)
Anusol not covered.  GoodRx has 12 suppositories for $28 at [facility] on [address]. Called and spoke to patient. She would like it sent to [facility]. She will get the GoodRx app if needed. Script sent to [facility] pharmacy

## 2021-12-24 ENCOUNTER — Other Ambulatory Visit (INDEPENDENT_AMBULATORY_CARE_PROVIDER_SITE_OTHER): Payer: 59

## 2021-12-24 ENCOUNTER — Telehealth: Payer: Self-pay | Admitting: Physician Assistant

## 2021-12-24 ENCOUNTER — Other Ambulatory Visit: Payer: Self-pay

## 2021-12-24 DIAGNOSIS — E876 Hypokalemia: Secondary | ICD-10-CM

## 2021-12-24 DIAGNOSIS — D649 Anemia, unspecified: Secondary | ICD-10-CM

## 2021-12-24 LAB — IBC + FERRITIN
Ferritin: 20.8 ng/mL (ref 10.0–291.0)
Iron: 16 ug/dL — ABNORMAL LOW (ref 42–145)
Saturation Ratios: 4.6 % — ABNORMAL LOW (ref 20.0–50.0)
TIBC: 351.4 ug/dL (ref 250.0–450.0)
Transferrin: 251 mg/dL (ref 212.0–360.0)

## 2021-12-24 LAB — HEPATITIS B SURFACE ANTIGEN: Hepatitis B Surface Ag: NONREACTIVE

## 2021-12-24 LAB — SERIAL MONITORING

## 2021-12-24 LAB — MAGNESIUM: Magnesium: 2.1 mg/dL (ref 1.5–2.5)

## 2021-12-24 NOTE — Telephone Encounter (Signed)
Discussed with Dr. Silverio Decamp. Patient without infection on biopsy results, has failed antiTNF and stelera Has active UC at this time with psoriatic arthritis.  Will give samples of RInvoq 45 mg once daily for 8 weeks for induction, should see result in 1-2 weeks.  Set up lab only in 6 weeks CBC, LFTs, and lipid panel. Remind she needs to get her shingrix vaccine with PCP or pharmacy.  Please work on getting rinvoq approved with her insurance.  Need OV right after labs, can be with 330 slot with me.  Thanks!

## 2021-12-25 ENCOUNTER — Encounter: Payer: Self-pay | Admitting: Gastroenterology

## 2021-12-25 LAB — ADALIMUMAB+AB (SERIAL MONITOR)
Adalimumab Drug Level: 3.5 ug/mL
Anti-Adalimumab Antibody: <25 ng/mL

## 2021-12-26 ENCOUNTER — Other Ambulatory Visit: Payer: Self-pay

## 2021-12-26 ENCOUNTER — Encounter: Payer: Self-pay | Admitting: Gastroenterology

## 2021-12-26 DIAGNOSIS — E876 Hypokalemia: Secondary | ICD-10-CM

## 2021-12-26 DIAGNOSIS — K5180 Other ulcerative colitis without complications: Secondary | ICD-10-CM

## 2021-12-26 MED ORDER — RINVOQ 45 MG PO TB24: 45.0000 mg | ORAL_TABLET | Freq: Every day | ORAL | 0 refills | Status: DC

## 2021-12-26 NOTE — Telephone Encounter (Signed)
Spoke with patient regarding lab results & recommendations. Lab orders placed. She has been advised to come in to recheck labs in 2 weeks & then again in 6 weeks. Instructions given on when/where to go for labs. OV scheduled for 02/17/22 at 8:30 am with [place], [place]. She will get labs prior to [place]. We are currently out of Rinvoq samples, however drug rep plans to stop by tomorrow. Will contact patient when we have samples & she will come by to pick up at second floor front desk and drop off stool samples to lab same day. Prescription also sent to pharmacy for prior auth process to begin.

## 2021-12-26 NOTE — Telephone Encounter (Signed)
Refer to phone note from 12/24/21 for further documentation.

## 2021-12-27 ENCOUNTER — Encounter: Payer: Self-pay | Admitting: Gastroenterology

## 2021-12-27 ENCOUNTER — Other Ambulatory Visit: Payer: 59

## 2021-12-27 DIAGNOSIS — K5180 Other ulcerative colitis without complications: Secondary | ICD-10-CM

## 2021-12-27 NOTE — Telephone Encounter (Signed)
Spoke with patient & advised that samples have been placed at 2nd floor front desk and she can come by at earliest convenience to pick them up. Stool samples pending. She is aware we will contact her once results are available.

## 2021-12-28 LAB — QUANTIFERON-TB GOLD PLUS
Mitogen-NIL: >10 IU/mL
NIL: 0.18 IU/mL
QuantiFERON-TB Gold Plus: NEGATIVE
TB1-NIL: <0 IU/mL
TB2-NIL: <0 IU/mL

## 2021-12-30 LAB — GI PROFILE, STOOL, PCR

## 2021-12-30 NOTE — Telephone Encounter (Signed)
Called patient to discuss her questions. Patient reassured that Dr. Silverio Decamp said with recent colonoscopy no signs of infection, this is from her UC.  Not need to do stool samples or wait for them to come back to start new medication. Discussed anti-TNF failure and however invoke is a different medication, able to stop Humira and start Rinvoq with samples. If her insurance does not cover invoke we will work to get it or if they have another suggestion she needs to do prior we will work on that. Patient picked up samples, will start today, will message me back in 1 week and let me know how she is doing.

## 2021-12-31 ENCOUNTER — Encounter: Payer: Self-pay | Admitting: Internal Medicine

## 2022-01-01 LAB — CALPROTECTIN: Calprotectin: 3550 mcg/g — ABNORMAL HIGH

## 2022-01-06 ENCOUNTER — Telehealth: Payer: Self-pay

## 2022-01-06 ENCOUNTER — Other Ambulatory Visit (HOSPITAL_COMMUNITY): Payer: Self-pay

## 2022-01-06 NOTE — Telephone Encounter (Signed)
Patient Advocate Encounter   Received notification from [facility] that prior authorization for Rinvoq 94m is required.   PA submitted on 01/06/2022 Key [id number] Status is pending       AJoneen Boers CManassasPatient Advocate Specialist CEl CentroPatient Advocate Team Direct Number: [phone]: [phone]

## 2022-01-08 ENCOUNTER — Other Ambulatory Visit (HOSPITAL_COMMUNITY): Payer: Self-pay

## 2022-01-08 NOTE — Telephone Encounter (Signed)
Patient Advocate Encounter  Prior Authorization for RINVOQ 45MG has been approved.    PA# [id number] Effective dates: 11.8.23 through 11.8.24  Khamiyah Grefe B. CPhT P: [phone] F: [phone]

## 2022-01-09 ENCOUNTER — Other Ambulatory Visit: Payer: Self-pay

## 2022-01-09 NOTE — Telephone Encounter (Signed)
[facility] to number provided.

## 2022-01-14 ENCOUNTER — Other Ambulatory Visit: Payer: Self-pay | Admitting: Physician Assistant

## 2022-01-14 DIAGNOSIS — K5180 Other ulcerative colitis without complications: Secondary | ICD-10-CM

## 2022-01-14 NOTE — Telephone Encounter (Signed)
Patient is requesting Lialda. Please advise.

## 2022-01-15 ENCOUNTER — Encounter (INDEPENDENT_AMBULATORY_CARE_PROVIDER_SITE_OTHER): Payer: Self-pay

## 2022-01-16 NOTE — Telephone Encounter (Signed)
Beth, please let me know if I need to do peer to peer to approve rinvoq?

## 2022-01-23 ENCOUNTER — Other Ambulatory Visit (HOSPITAL_COMMUNITY): Payer: Self-pay

## 2022-02-13 NOTE — Progress Notes (Signed)
02/17/2022 Meagan Grimes [id number] [birth year]  Referring provider: Isaac Bliss, [place]* Primary GI doctor: Dr. 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 UC diagnosed 2018 after colonoscopy, no history of complications Colonoscopy 12/2021 Mayo score 2 Was having 10-15 bowel movements daily, blood in the stool elevated CRP, sed rate, fecal calprotectin, negative GI pathogen panel Rinvoq started 10/24 with significant improvement in her clinical symptoms, has gained 4 lbs, one BM a day without urgency.  Will repeat lipid panel, LFTs, and objective inflammatory markers.    Patient will likely benefit from going down to maintenance dose of 30 mg daily of Rinvoq versus 29m but will discuss with Dr. NSilverio DecampContinue with Shingrix vaccinations, [facility] NSilverio Decampfirst available appointment in about 3 months.  Recall colonoscopy 1 year ( 12/2022)  Vitamin B12 deficiency Continue treatment  Vitamin D deficiency Continue treatment   [place]atic [place] Previously on Humira, now on rinvoq Follows with Dr. BAmil AmenHas some minor psoriasis on her scalp, monitor.  Hemorrhoids Recent and hydrocortisone cream, limit toilet time, get squatty potty.  Iron deficiency anemia Has been on iron once daily, recheck iron.    History of Present Illness:  46y.o. female presents for evaluation of ulcerative colitis. Last seen in the office on 12/23/2021  IBD history (prior medications titers why stopped and current meds with last dose): Diagnosed 10/2016 after colonoscopy for diarrhea and rectal bleeding by Dr. NSilverio Decampwhich showed moderate information with ulcers in rectum, sigmoid and descending colon.  Right colon and terminal ileum  normal. Patient was already on Humira for psoriatic arthritis every other week. Started on Lialda 4.8 daily and Uceris 9 mg daily at that time. 02/17/2018 worsening rectal bleeding and diarrhea with posterior fissure, treated nitroglycerin 09/20/2019 diarrhea, started on prednisone taper CT abdomen pelvis showed thickening of sigmoid colon with UC flare. 11/22/2020 adalimumab drug level and antibodies showed low trough at 2.4, no antibodies. 12/20/2020 colonoscopy for GI bleeding and UC showed continuous area of bleeding ulcerated mucosa rectum into the sigmoid and descending colon.  Pathology showed moderately active chronic colitis, negative dysplasia Increased Humira 40 mg subcutaneously every week and continued on budesonide 9 mg daily for 2 months. 12/17/2021 Colonoscopy Assess therapeutic response showed nonthrombosed external hemorrhoids, moderately active Mayo score 2 pan colitis ulcerative colitis, recall colonoscopy 1 year 12/23/2021 office visit for diarrhea and weight loss showed high-sensitivity CRP 95, sed rate 129, fecal calprotectin over 3000, negative GI pathogen panel.   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  Other significant medical history: Patient is on oral B12 Patient has psoriatic arthritis follows with Dr.Beekman Patient has vitamin D deficiency Patient has iron deficiency anemia  Current History 12/23/2021 seen in the office for worsening diarrhea, bright red blood, abdominal discomfort, weight loss of 13 pounds. Had elevated high-sensitivity  CRP 95, sed rate 129, fecal calprotectin 3559, hemoglobin 10.7 and iron 16. 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  Rinvoq 45 mg induction started on 12/24/21, 8 weeks total and should be going to 30 mg daily.  Has BM once a day or twice a day, rare urgency.  Weight is up 4 lbs.  Still has hemorrhoids which she is dealing with cream, rare bleeding with wiping, not in stools.  No AB pain, takes dicyclomine every morning.  She is on iron once a day.   Recent labs: 12/23/2021 hsCRP 95 12/23/2021 SED RATE 129 (01/31/2021 SED RATE 51) 12/23/2021 Fecal cal 3550 12/23/2021 WBC 9.8 HGB 10.7 MCV 79.8 Platelets 480.0 (11/13/2021 WBC 7.2 HGB 11.3) 12/24/2021 Iron 16 Ferritin 20.8  B12 >1500 12/23/2021 AST 11 ALT 9 Alkphos 74 TBili 0.3 11/13/2021 VITAMIN D 59.82  TB GOLD 12/23/2021 NEGATIVE or TB skin if indeterminate.  HepBsAG 12/23/2021 NON-REACTIVE  TPMT Activity: No results found    IBD Health Care Maintenance: Annual Flu Vaccine -  UTD Pneumococcal Vaccine if receiving immunosuppression: -  Suggest getting TB testing if on anti-TNF, yearly - will get COVID vaccine UTD Shingrix told to go to [employer] Readings from Last 3 Encounters:  02/17/22 209 lb 8 oz (95 kg)  12/23/21 205 lb (93 kg)  12/17/21 211 lb (95.7 kg)     Immunization History  Administered Date(s) Administered   Influenza-Unspecified 11/02/2018   PFIZER Comirnaty(Gray Top)Covid-19 Tri-Sucrose Vaccine 08/10/2020   PFIZER(Purple Top)SARS-COV-2 Vaccination 05/22/2019, 06/12/2019, 12/30/2019   Tdap 09/29/2016     She  reports that she has quit smoking. She has never used smokeless tobacco. She reports current alcohol use of about 5.0 standard drinks of alcohol per week. She reports that she does not use drugs. Her family history includes Heart  disease in her maternal grandfather and paternal grandmother; Hypertension in her father, maternal grandfather, mother, and paternal grandmother; Lung cancer in her maternal aunt and maternal grandmother; Mental illness in her father.   Current Medications:   Current Outpatient Medications (Endocrine & Metabolic):    budesonide (ENTOCORT EC) 3 MG 24 hr capsule, TAKE 3 CAPSULES (9 MG TOTAL) BY MOUTH DAILY. (Patient not taking: Reported on 12/09/2021)  Current Outpatient Medications (Cardiovascular):    hydrochlorothiazide (HYDRODIURIL) 25 MG tablet, TAKE ONE TABLET BY MOUTH DAILY   Current Outpatient Medications (Analgesics):    Upadacitinib ER (RINVOQ) 30 MG TB24, Take 30 mg by mouth daily.   Current Outpatient Medications (Other):    ALPRAZolam (XANAX) 0.5 MG tablet, Take 0.5 mg by mouth daily.   clobetasol ointment (TEMOVATE) 2.70 %, Apply 1 application  topically as needed.   dicyclomine (BENTYL) 20 MG tablet, TAKE 1 TABLET (20 MG TOTAL) BY MOUTH EVERY 6 (SIX) HOURS AS NEEDED FOR SPASMS.   Omega-3 Fatty Acids (FISH OIL ADULT GUMMIES PO), Take by mouth.   valACYclovir (VALTREX) 500 MG tablet, Take 500 mg by mouth daily.   VITAMIN D, ERGOCALCIFEROL, PO, Take 1,000 Units by mouth.   hydrocortisone (ANUSOL-HC) 2.5 % rectal cream, Place 1 Application rectally 2 (two) times daily.   mesalamine (LIALDA) 1.2 g EC tablet, TAKE 2 TABLETS BY MOUTH DAILY WITH BREAKFAST.   omeprazole (PRILOSEC OTC) 20 MG tablet, Take 20 mg by mouth daily. (Patient not taking: Reported on 12/09/2021)  Surgical History:  She  has a past surgical history that includes Cesarean section; Laser ablation; and Colonoscopy.  Current Medications, Allergies, Past Medical History, Past Surgical  History, Family History and Social History were reviewed in [employer] record.  Physical Exam: BP 116/74   Pulse 71   Ht 5' 6"  (1.676 m)   Wt 209 lb 8 oz (95 kg)   BMI 33.81 kg/m  General:   Pleasant,  well developed female in no acute distress Heart : Regular rate and rhythm; no murmurs Pulm: Clear anteriorly; no wheezing Abdomen:  Soft, Obese AB, Active bowel sounds. No tenderness . Without guarding and Without rebound, No organomegaly appreciated. Rectal: Not evaluated Extremities:  without  edema. Neurologic:  Alert and  oriented x4;  No focal deficits.  Psych:  Cooperative. Normal mood and affect.   Vladimir Crofts, PA-C 02/17/22

## 2022-02-17 ENCOUNTER — Encounter: Payer: Self-pay | Admitting: Physician Assistant

## 2022-02-17 ENCOUNTER — Other Ambulatory Visit (INDEPENDENT_AMBULATORY_CARE_PROVIDER_SITE_OTHER): Payer: 59

## 2022-02-17 ENCOUNTER — Ambulatory Visit: Payer: 59 | Admitting: Physician Assistant

## 2022-02-17 VITALS — BP 116/74 | HR 71 | Ht 66.0 in | Wt 209.5 lb

## 2022-02-17 DIAGNOSIS — K5180 Other ulcerative colitis without complications: Secondary | ICD-10-CM

## 2022-02-17 DIAGNOSIS — E559 Vitamin D deficiency, unspecified: Secondary | ICD-10-CM | POA: Diagnosis not present

## 2022-02-17 DIAGNOSIS — E538 Deficiency of other specified B group vitamins: Secondary | ICD-10-CM | POA: Diagnosis not present

## 2022-02-17 DIAGNOSIS — K625 Hemorrhage of anus and rectum: Secondary | ICD-10-CM

## 2022-02-17 DIAGNOSIS — M069 Rheumatoid arthritis, unspecified: Secondary | ICD-10-CM | POA: Diagnosis not present

## 2022-02-17 DIAGNOSIS — Z79899 Other long term (current) drug therapy: Secondary | ICD-10-CM | POA: Diagnosis not present

## 2022-02-17 DIAGNOSIS — D509 Iron deficiency anemia, unspecified: Secondary | ICD-10-CM | POA: Diagnosis not present

## 2022-02-17 LAB — LIPID PANEL
Cholesterol: 210 mg/dL — ABNORMAL HIGH (ref 0–200)
HDL: 56.8 mg/dL (ref >39.00)
NonHDL: 153.46
Total CHOL/HDL Ratio: 4
Triglycerides: 263 mg/dL — ABNORMAL HIGH (ref 0.0–149.0)
VLDL: 52.6 mg/dL — ABNORMAL HIGH (ref 0.0–40.0)

## 2022-02-17 LAB — CBC WITH DIFFERENTIAL/PLATELET
Basophils Absolute: 0 10*3/uL (ref 0.0–0.1)
Basophils Relative: 0.4 % (ref 0.0–3.0)
Eosinophils Absolute: 0 10*3/uL (ref 0.0–0.7)
Eosinophils Relative: 0.4 % (ref 0.0–5.0)
HCT: 31.5 % — ABNORMAL LOW (ref 36.0–46.0)
Hemoglobin: 10.4 g/dL — ABNORMAL LOW (ref 12.0–15.0)
Lymphocytes Relative: 21.8 % (ref 12.0–46.0)
Lymphs Abs: 2.3 10*3/uL (ref 0.7–4.0)
MCHC: 33 g/dL (ref 30.0–36.0)
MCV: 78.8 fl (ref 78.0–100.0)
Monocytes Absolute: 0.8 10*3/uL (ref 0.1–1.0)
Monocytes Relative: 7.6 % (ref 3.0–12.0)
Neutro Abs: 7.3 10*3/uL (ref 1.4–7.7)
Neutrophils Relative %: 69.8 % (ref 43.0–77.0)
Platelets: 564 10*3/uL — ABNORMAL HIGH (ref 150.0–400.0)
RBC: 4 Mil/uL (ref 3.87–5.11)
RDW: 22 % — ABNORMAL HIGH (ref 11.5–15.5)
WBC: 10.4 10*3/uL (ref 4.0–10.5)

## 2022-02-17 LAB — HIGH SENSITIVITY CRP: CRP, High Sensitivity: 4.93 mg/L (ref 0.000–5.000)

## 2022-02-17 LAB — HEPATIC FUNCTION PANEL
ALT: 19 U/L (ref 0–35)
AST: 22 U/L (ref 0–37)
Albumin: 4 g/dL (ref 3.5–5.2)
Alkaline Phosphatase: 60 U/L (ref 39–117)
Bilirubin, Direct: 0.1 mg/dL (ref 0.0–0.3)
Total Bilirubin: 0.5 mg/dL (ref 0.2–1.2)
Total Protein: 7.9 g/dL (ref 6.0–8.3)

## 2022-02-17 LAB — BASIC METABOLIC PANEL
BUN: 13 mg/dL (ref 6–23)
CO2: 29 mEq/L (ref 19–32)
Calcium: 9.4 mg/dL (ref 8.4–10.5)
Chloride: 98 mEq/L (ref 96–112)
Creatinine, Ser: 0.97 mg/dL (ref 0.40–1.20)
GFR: 69.67 mL/min (ref >60.00)
Glucose, Bld: 95 mg/dL (ref 70–99)
Potassium: 3.8 mEq/L (ref 3.5–5.1)
Sodium: 136 mEq/L (ref 135–145)

## 2022-02-17 LAB — IBC + FERRITIN
Ferritin: 4.8 ng/mL — ABNORMAL LOW (ref 10.0–291.0)
Iron: 87 ug/dL (ref 42–145)
Saturation Ratios: 17.8 % — ABNORMAL LOW (ref 20.0–50.0)
TIBC: 488.6 ug/dL — ABNORMAL HIGH (ref 250.0–450.0)
Transferrin: 349 mg/dL (ref 212.0–360.0)

## 2022-02-17 LAB — LDL CHOLESTEROL, DIRECT: Direct LDL: 123 mg/dL

## 2022-02-17 LAB — SEDIMENTATION RATE: Sed Rate: 44 mm/hr — ABNORMAL HIGH (ref 0–20)

## 2022-02-17 MED ORDER — RINVOQ 30 MG PO TB24: 30.0000 mg | ORAL_TABLET | Freq: Every day | ORAL | 3 refills | Status: DC

## 2022-02-17 MED ORDER — HYDROCORTISONE (PERIANAL) 2.5 % EX CREA: 1.0000 | TOPICAL_CREAM | Freq: Two times a day (BID) | CUTANEOUS | 2 refills | Status: AC

## 2022-02-17 NOTE — Patient Instructions (Addendum)
Your provider has requested that you go to the basement level for lab work before leaving today. Press "B" on the elevator. The lab is located at the first door on the left as you exit the elevator.  Toileting tips to help with your constipation - Drink at least 64-80 ounces of water/liquid per day. - Establish a time to try to move your bowels every day.  For many people, this is after a cup of coffee or after a meal such as breakfast. - Sit all of the way back on the toilet keeping your back fairly straight and while sitting up, try to rest the tops of your forearms on your upper thighs.   - Raising your feet with a step stool/squatty potty can be helpful to improve the angle that allows your stool to pass through the rectum. - Relax the rectum feeling it bulge toward the toilet water.  If you feel your rectum raising toward your body, you are contracting rather than relaxing. - Breathe in and slowly exhale. "Belly breath" by expanding your belly towards your belly button. Keep belly expanded as you gently direct pressure down and back to the anus.  A low pitched GRRR sound can assist with increasing intra-abdominal pressure.  - Repeat 3-4 times. If unsuccessful, contract the pelvic floor to restore normal tone and get off the toilet.  Avoid excessive straining. - To reduce excessive wiping by teaching your anus to normally contract, place hands on outer aspect of knees and resist knee movement outward.  Hold 5-10 second then place hands just inside of knees and resist inward movement of knees.  Hold 5 seconds.  Repeat a few times each way.  Start on the Rinvoq 30 mg  Will follow up 3-4 months Dr. Silverio Decamp. Please call [phone] in February for a March or April appointment  Get the shingles vaccine at the [facility] long pharmacy  It was a pleasure to see you today!  Thank you for trusting me with your gastrointestinal care!

## 2022-02-21 ENCOUNTER — Other Ambulatory Visit: Payer: Self-pay | Admitting: Gastroenterology

## 2022-04-15 ENCOUNTER — Encounter: Payer: Self-pay | Admitting: Internal Medicine

## 2022-04-16 ENCOUNTER — Encounter: Payer: Self-pay | Admitting: Internal Medicine

## 2022-04-16 ENCOUNTER — Ambulatory Visit: Payer: 59 | Admitting: Internal Medicine

## 2022-04-16 VITALS — BP 142/87 | HR 73 | Temp 98.2°F | Wt 211.4 lb

## 2022-04-16 DIAGNOSIS — I10 Essential (primary) hypertension: Secondary | ICD-10-CM | POA: Diagnosis not present

## 2022-04-16 MED ORDER — AMLODIPINE BESYLATE 5 MG PO TABS: 5.0000 mg | ORAL_TABLET | Freq: Every day | ORAL | 1 refills | Status: DC

## 2022-04-16 NOTE — Progress Notes (Signed)
Established Patient Office Visit     CC/Reason for Visit: Discuss blood pressure  HPI: Meagan Grimes is a 47 y.o. female who is coming in today for the above mentioned reasons.  She has been noticing her blood pressure being elevated recently at home.  Recent home measurements are 144/97, 158/104, 150/97.  She has been adherent to HCTZ 25 mg daily.   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 She has never used smokeless tobacco. She reports current alcohol use of about 5.0 standard drinks of alcohol per week. She reports that she does not use drugs.  Allergies: Allergies  Allergen Reactions   No Known Allergies     Family History:  Family History  Problem Relation Age of Onset   Hypertension Mother    Hypertension Father    Mental illness Father    Lung cancer Maternal Aunt    Lung cancer Maternal Grandmother    Hypertension Maternal Grandfather    Heart disease Maternal Grandfather    Heart disease Paternal Grandmother    Hypertension Paternal Grandmother    Colon cancer Neg Hx    Esophageal cancer Neg Hx    Rectal cancer Neg Hx    Liver cancer Neg Hx    Stomach cancer Neg Hx    Colon polyps Neg Hx      Current Outpatient Medications:    ALPRAZolam (XANAX) 0.5 MG tablet, Take 0.5 mg by mouth daily., Disp: , Rfl:    amLODipine (NORVASC) 5 MG tablet, Take 1 tablet (5 mg total) by mouth daily., Disp: 90 tablet, Rfl: 1   dicyclomine (BENTYL) 20 MG tablet, TAKE 1 TABLET (20 MG TOTAL) BY MOUTH EVERY 6 (SIX) HOURS AS NEEDED FOR SPASMS., Disp: 360 tablet, Rfl: 0   hydrochlorothiazide (HYDRODIURIL) 25 MG tablet, TAKE ONE TABLET BY MOUTH  DAILY, Disp: 90 tablet, Rfl: 1   hydrocortisone (ANUSOL-HC) 2.5 % rectal cream, Place 1 Application rectally 2 (two) times daily., Disp: 30 g, Rfl: 2   Omega-3 Fatty Acids (FISH OIL ADULT GUMMIES PO), Take by mouth., Disp: , Rfl:    Upadacitinib ER (RINVOQ) 30 MG TB24, Take 30 mg by mouth daily., Disp: 90 tablet, Rfl: 3   VITAMIN D, ERGOCALCIFEROL, PO, Take 1,000 Units by mouth., Disp: , Rfl:   Review of Systems:  Negative unless indicated in HPI.   Physical Exam: Vitals:   04/16/22 1008 04/16/22 1010  BP: (!) 150/90 (!) 142/87  Pulse: 73   Temp: 98.2 F (36.8 C)   TempSrc: Oral   SpO2: 98%   Weight: 211 lb 6.4 oz (95.9 kg)     Body mass index is 34.12 kg/m.   Physical Exam Vitals reviewed.  Constitutional:      Appearance: Normal appearance.  HENT:     Head: Normocephalic and atraumatic.  Eyes:     Conjunctiva/sclera: Conjunctivae normal.     Pupils: Pupils are equal, round, and reactive to light.  Cardiovascular:     Rate and Rhythm: Normal rate and regular rhythm.  Pulmonary:     Effort: Pulmonary effort  is normal.     Breath sounds: Normal breath sounds.  Skin:    General: Skin is warm and dry.  Neurological:     General: No focal deficit present.     Mental Status: She is alert and oriented to person, place, and time.  Psychiatric:        Mood and Affect: Mood normal.        Behavior: Behavior normal.        Thought Content: Thought content normal.        Judgment: Judgment normal.      Impression and Plan:  Primary hypertension - Plan: amLODipine (NORVASC) 5 MG tablet  -Not well-controlled on HCTZ alone, add amlodipine 5 mg and return in 6 to 8 weeks for follow-up.   Time spent:30 minutes reviewing chart, interviewing and examining patient and formulating plan of care.     Lelon Frohlich, MD [facility] at [facility]

## 2022-04-28 ENCOUNTER — Encounter: Payer: Self-pay | Admitting: Gastroenterology

## 2022-05-19 IMAGING — CT CT ABD-PELV W/ CM
2 of 5 series · 16 of 46 positions shown, 18 images · IV contrast (OMNIPAQUE 300)
Comparison: CT of the abdomen pelvis dated 05/14/2004.

CLINICAL DATA: 43-year-old female with right upper quadrant
abdominal pain. History of ulcerative colitis.

EXAM:
CT ABDOMEN AND PELVIS WITH CONTRAST
TECHNIQUE: Multidetector CT imaging of the abdomen and pelvis was performed
using the standard protocol following bolus administration of
intravenous contrast.
CONTRAST:  100mL OMNIPAQUE IOHEXOL 300 MG/ML  SOLN

[Series 2: abd/pel w · axial · 0.75mm/px · z∈[-479,-54]mm · 13 of 97 slices shown, 15 images]
[im 6/97  soft-tissue]
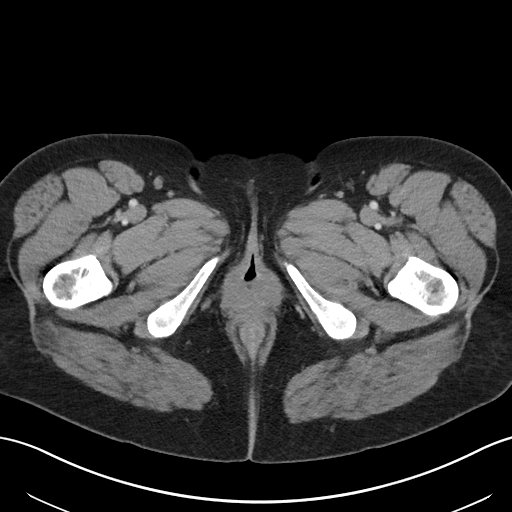
[im 6/97  bone]
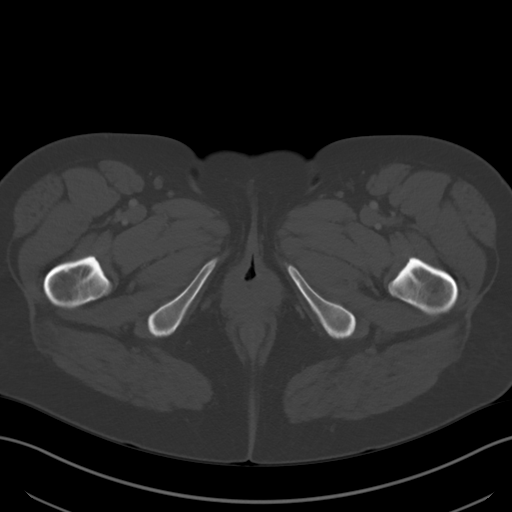
[im 11/97  soft-tissue]
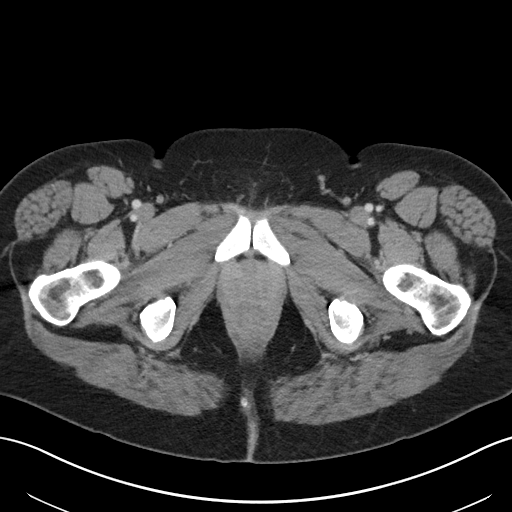
[im 22/97  soft-tissue]
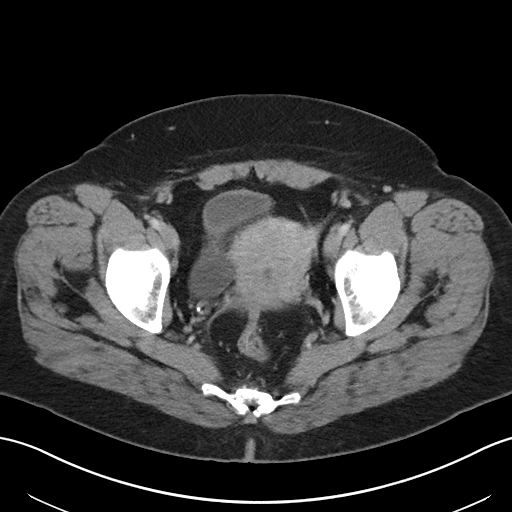
[im 27/97  soft-tissue]
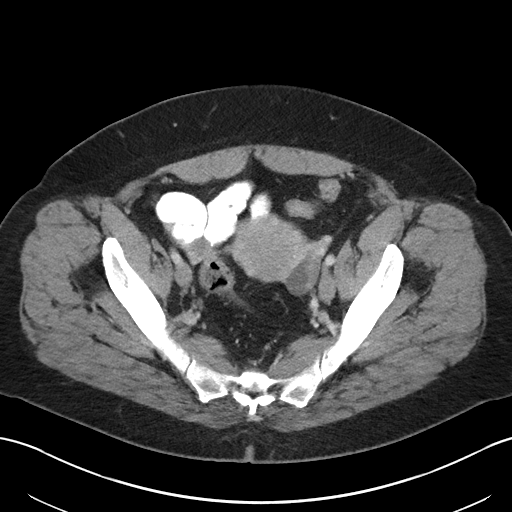
[im 33/97  soft-tissue]
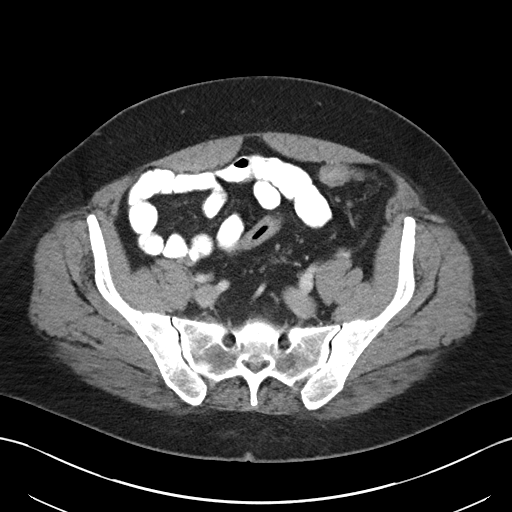
[im 43/97  soft-tissue]
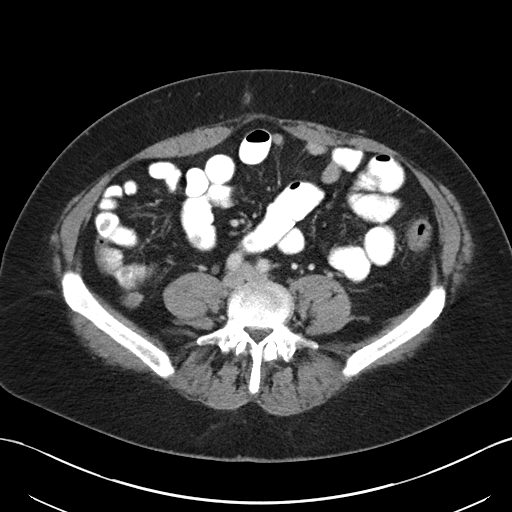
[im 49/97  soft-tissue]
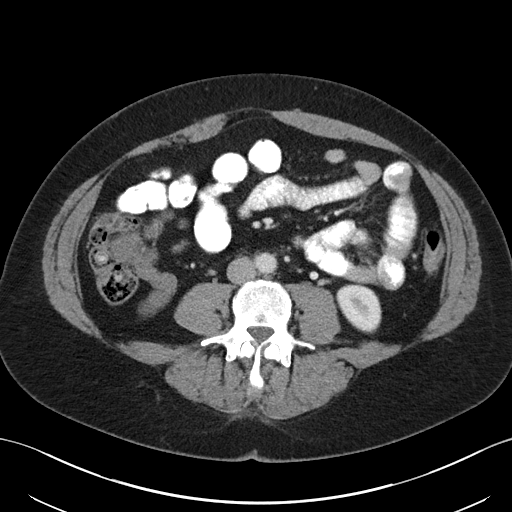
[im 54/97  soft-tissue]
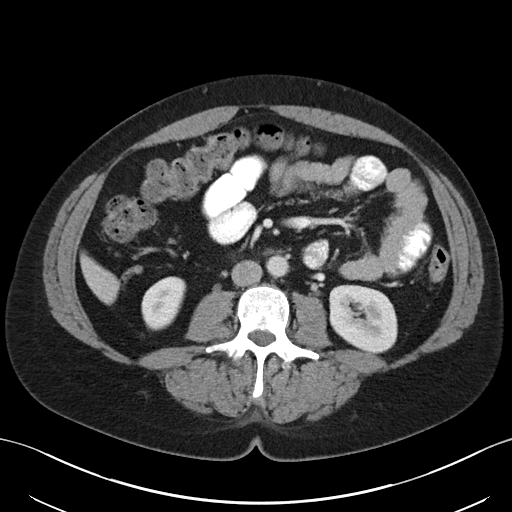
[im 65/97  soft-tissue]
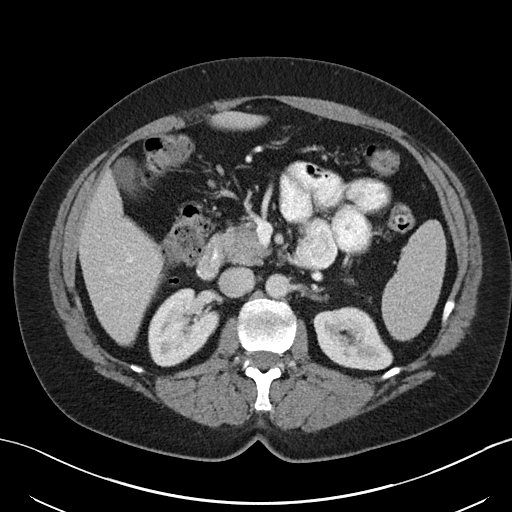
[im 65/97  bone]
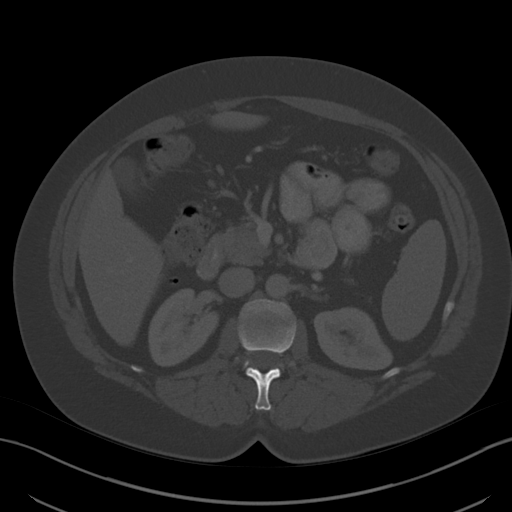
[im 70/97  soft-tissue]
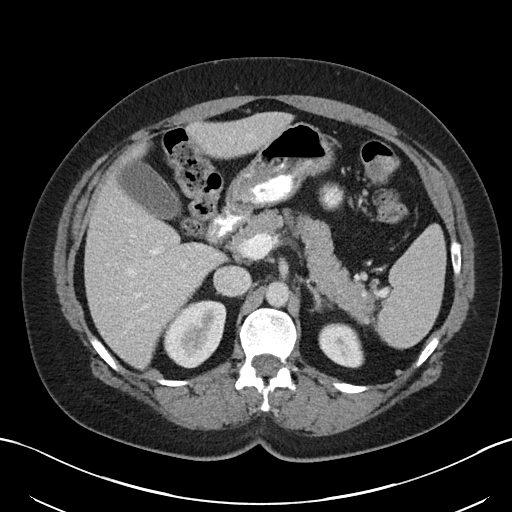
[im 75/97  soft-tissue]
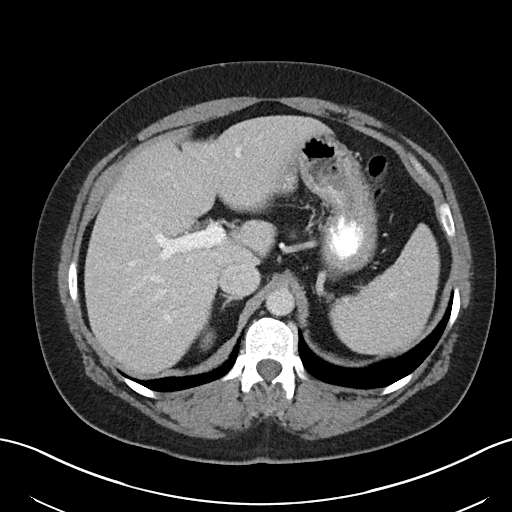
[im 86/97  soft-tissue]
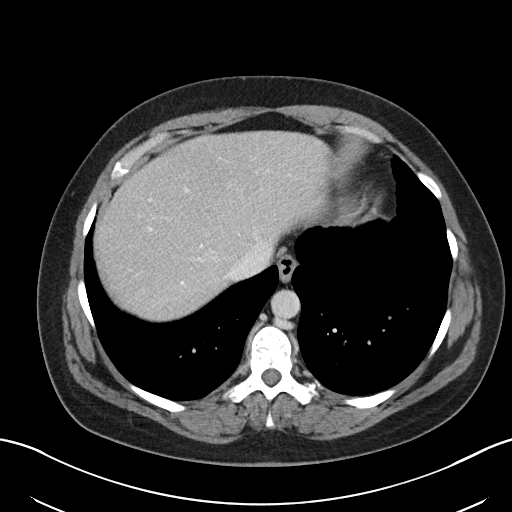
[im 91/97  soft-tissue]
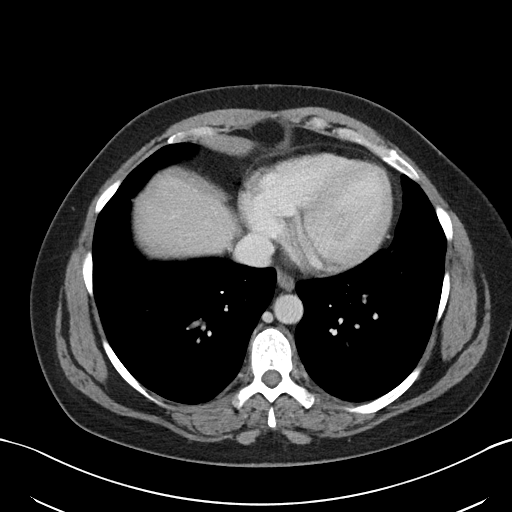

[Series 5: abd/pel w st · coronal · 0.68mm/px · 3 of 84 slices shown]
[im 28/84  soft-tissue]
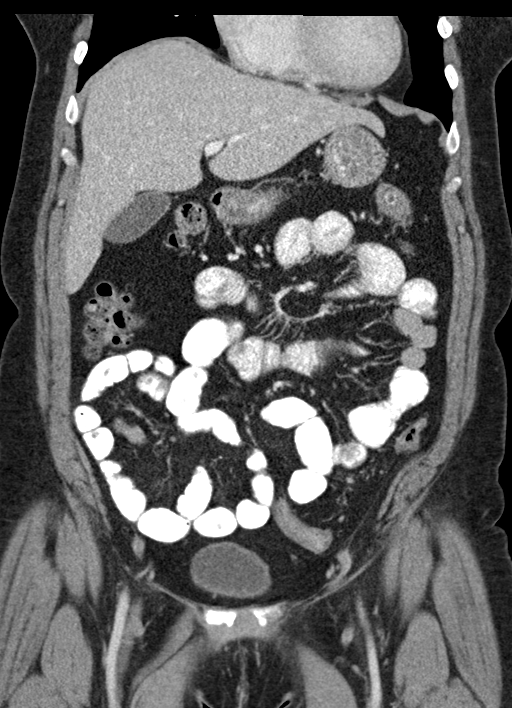
[im 37/84  soft-tissue]
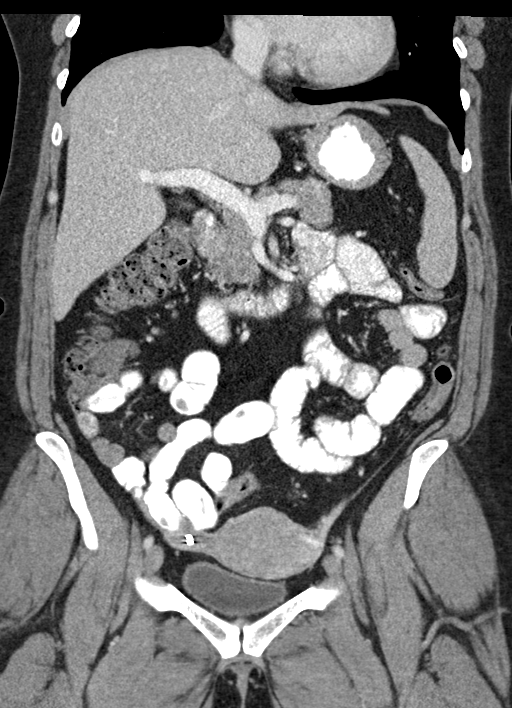
[im 47/84  soft-tissue]
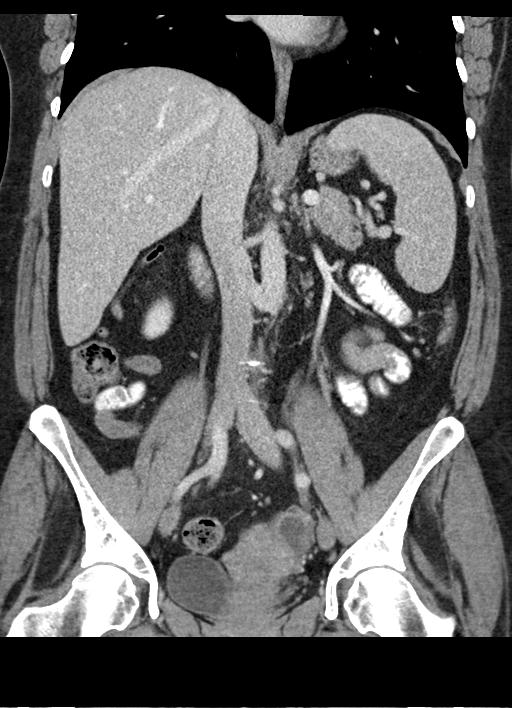

[16 of 46 positions shown; findings below may reference images not displayed]

FINDINGS: Lower chest: The visualized lung bases are clear.

No intra-abdominal free air or free fluid.

Hepatobiliary: No focal liver abnormality is seen. No gallstones,
gallbladder wall thickening, or biliary dilatation.

Pancreas: Unremarkable. No pancreatic ductal dilatation or
surrounding inflammatory changes.

Spleen: Normal in size without focal abnormality.

Adrenals/Urinary Tract: Adrenal glands are unremarkable. Kidneys are
normal, without renal calculi, focal lesion, or hydronephrosis.
Bladder is unremarkable.

Stomach/Bowel: There is segmental thickening of the sigmoid colon,
likely related to underdistention. There is an apparent featureless
segment (66/2) of the sigmoid colon likely sequela of prior
inflammation. There is no bowel obstruction or active inflammation.
The appendix is normal.

Vascular/Lymphatic: The abdominal aorta IVC are unremarkable. There
is a retroaortic left renal vein anatomy. No portal venous gas.
There is no adenopathy.

Reproductive: The uterus is grossly unremarkable. Bilateral tubal
ligation clips. Dominant left ovarian follicle.

Other: None

Musculoskeletal: Degenerative changes of the lower lumbar spine. No
acute osseous pathology.
IMPRESSION: No acute intra-abdominal or pelvic pathology. No bowel obstruction.
Normal appendix.

## 2022-05-24 ENCOUNTER — Other Ambulatory Visit: Payer: Self-pay | Admitting: Gastroenterology

## 2022-06-06 ENCOUNTER — Other Ambulatory Visit: Payer: Self-pay | Admitting: Internal Medicine

## 2022-06-06 DIAGNOSIS — I10 Essential (primary) hypertension: Secondary | ICD-10-CM

## 2022-06-17 NOTE — Progress Notes (Signed)
Reviewed and agree with documentation and assessment and plan. K. Veena Franny Selvage , MD   

## 2022-07-14 ENCOUNTER — Ambulatory Visit: Payer: 59 | Admitting: Internal Medicine

## 2022-07-14 ENCOUNTER — Encounter: Payer: Self-pay | Admitting: Internal Medicine

## 2022-07-14 VITALS — BP 120/84 | HR 82 | Temp 98.6°F | Wt 217.8 lb

## 2022-07-14 DIAGNOSIS — I10 Essential (primary) hypertension: Secondary | ICD-10-CM

## 2022-07-14 DIAGNOSIS — M069 Rheumatoid arthritis, unspecified: Secondary | ICD-10-CM | POA: Diagnosis not present

## 2022-07-14 DIAGNOSIS — K5180 Other ulcerative colitis without complications: Secondary | ICD-10-CM | POA: Diagnosis not present

## 2022-07-14 NOTE — Progress Notes (Signed)
Established Patient Office Visit     CC/Reason for Visit: Follow-up blood pressure  HPI: Meagan Grimes is a 47 y.o. female who is coming in today for the above mentioned reasons. Past Medical History is significant for: Hypertension.  At last visit we added amlodipine 5 mg to her 25 mg of daily hydrochlorothiazide.  She states that home measurements are usually in the 120/80-84 range.   Past Medical/Surgical History: Past Medical History:  Diagnosis Date   Anxiety    Arthritis    Arthropathic psoriasis (HCC)    Crohn's disease (HCC)    Hypertension    Rheumatoid arthritis (HCC)     Past Surgical History:  Procedure Laterality Date   CESAREAN SECTION     COLONOSCOPY     LASER ABLATION      Social History:  reports that she has quit smoking. She has never used smokeless tobacco. She reports current alcohol use of about 5.0 standard drinks of alcohol per week. She reports that she does not use drugs.  Allergies: Allergies  Allergen Reactions   No Known Allergies     Family History:  Family History  Problem Relation Age of Onset   Hypertension Mother    Hypertension Father    Mental illness Father    Lung cancer Maternal Aunt    Lung cancer Maternal Grandmother    Hypertension Maternal Grandfather    Heart disease Maternal Grandfather    Heart disease Paternal Grandmother    Hypertension Paternal Grandmother    Colon cancer Neg Hx    Esophageal cancer Neg Hx    Rectal cancer Neg Hx    Liver cancer Neg Hx    Stomach cancer Neg Hx    Colon polyps Neg Hx      Current Outpatient Medications:    ALPRAZolam (XANAX) 0.5 MG tablet, Take 0.5 mg by mouth daily., Disp: , Rfl:    amLODipine (NORVASC) 5 MG tablet, Take 1 tablet (5 mg total) by mouth daily., Disp: 90 tablet, Rfl: 1   hydrochlorothiazide (HYDRODIURIL) 25 MG tablet, TAKE 1 TABLET BY MOUTH DAILY, Disp: 90 tablet, Rfl: 0   hydrocortisone (ANUSOL-HC) 2.5 % rectal cream, Place 1 Application rectally 2  (two) times daily., Disp: 30 g, Rfl: 2   Omega-3 Fatty Acids (FISH OIL ADULT GUMMIES PO), Take by mouth., Disp: , Rfl:    Upadacitinib ER (RINVOQ) 30 MG TB24, Take 30 mg by mouth daily., Disp: 90 tablet, Rfl: 3   VITAMIN D, ERGOCALCIFEROL, PO, Take 1,000 Units by mouth., Disp: , Rfl:   Review of Systems:  Negative unless indicated in HPI.   Physical Exam: Vitals:   07/14/22 0739 07/14/22 0742  BP: (!) 140/90 139/76  Pulse: 82   Temp: 98.6 F (37 C)   TempSrc: Oral   SpO2: 99%   Weight: 217 lb 12.8 oz (98.8 kg)     Body mass index is 35.15 kg/m.   Physical Exam Vitals reviewed.  Constitutional:      Appearance: Normal appearance.  HENT:     Head: Normocephalic and atraumatic.  Eyes:     Conjunctiva/sclera: Conjunctivae normal.     Pupils: Pupils are equal, round, and reactive to light.  Skin:    General: Skin is warm and dry.  Neurological:     General: No focal deficit present.     Mental Status: She is alert and oriented to person, place, and time.  Psychiatric:        Mood  and Affect: Mood normal.        Behavior: Behavior normal.        Thought Content: Thought content normal.        Judgment: Judgment normal.      Impression and Plan:  Primary hypertension  Other ulcerative colitis without complication (HCC)  Rheumatoid arthritis, involving unspecified site, unspecified whether rheumatoid factor present (HCC)   -Despite elevated blood pressure in office, home measurements are mostly within normal range, continue current regimen.   Time spent:24 minutes reviewing chart, interviewing and examining patient and formulating plan of care.     Chaya Jan, MD [facility] at [facility]

## 2022-09-05 ENCOUNTER — Other Ambulatory Visit: Payer: Self-pay | Admitting: Internal Medicine

## 2022-09-05 DIAGNOSIS — I10 Essential (primary) hypertension: Secondary | ICD-10-CM

## 2022-10-06 ENCOUNTER — Other Ambulatory Visit: Payer: Self-pay | Admitting: Internal Medicine

## 2022-10-06 DIAGNOSIS — I10 Essential (primary) hypertension: Secondary | ICD-10-CM

## 2022-10-30 ENCOUNTER — Other Ambulatory Visit (HOSPITAL_COMMUNITY): Payer: Self-pay

## 2022-10-31 ENCOUNTER — Other Ambulatory Visit: Payer: Self-pay | Admitting: Internal Medicine

## 2022-10-31 DIAGNOSIS — I10 Essential (primary) hypertension: Secondary | ICD-10-CM

## 2022-12-30 ENCOUNTER — Telehealth: Payer: Self-pay | Admitting: Pharmacy Technician

## 2022-12-30 NOTE — Telephone Encounter (Signed)
Pharmacy Patient Advocate Encounter   Received notification from CoverMyMeds that prior authorization for [facility] 30MG  is required/requested.   Insurance verification completed.   The patient is insured through [employer] [facility] .   Per test claim: PA required; PA submitted to [employer] [facility] via CoverMyMeds Key/confirmation #/EOC [id number] Status is pending

## 2023-01-08 ENCOUNTER — Other Ambulatory Visit (HOSPITAL_COMMUNITY): Payer: Self-pay

## 2023-01-08 NOTE — Telephone Encounter (Signed)
Pharmacy Patient Advocate Encounter  Received notification from [employer] [facility] that Prior Authorization for Rinvoq 30MG  er tablets has been APPROVED from 12/30/2022 to 12/30/2023   PA #/Case ID/Reference #: [id number]

## 2023-01-09 ENCOUNTER — Other Ambulatory Visit (HOSPITAL_COMMUNITY): Payer: Self-pay

## 2023-02-03 ENCOUNTER — Other Ambulatory Visit: Payer: Self-pay | Admitting: Physician Assistant

## 2023-03-12 ENCOUNTER — Other Ambulatory Visit: Payer: Self-pay | Admitting: Internal Medicine

## 2023-03-12 DIAGNOSIS — I10 Essential (primary) hypertension: Secondary | ICD-10-CM

## 2023-05-03 ENCOUNTER — Other Ambulatory Visit: Payer: Self-pay | Admitting: Internal Medicine

## 2023-05-03 DIAGNOSIS — I10 Essential (primary) hypertension: Secondary | ICD-10-CM

## 2023-06-08 ENCOUNTER — Other Ambulatory Visit: Payer: Self-pay | Admitting: Physician Assistant

## 2023-06-09 ENCOUNTER — Other Ambulatory Visit (HOSPITAL_COMMUNITY): Payer: Self-pay

## 2023-08-02 ENCOUNTER — Other Ambulatory Visit: Payer: Self-pay | Admitting: Internal Medicine

## 2023-08-02 DIAGNOSIS — I10 Essential (primary) hypertension: Secondary | ICD-10-CM

## 2023-09-10 ENCOUNTER — Other Ambulatory Visit: Payer: Self-pay | Admitting: Internal Medicine

## 2023-09-10 DIAGNOSIS — I10 Essential (primary) hypertension: Secondary | ICD-10-CM

## 2023-10-14 ENCOUNTER — Encounter: Payer: Self-pay | Admitting: Gastroenterology

## 2023-10-14 ENCOUNTER — Ambulatory Visit: Admitting: Gastroenterology

## 2023-10-14 VITALS — BP 128/82 | HR 76 | Ht 66.0 in | Wt 201.6 lb

## 2023-10-14 DIAGNOSIS — E559 Vitamin D deficiency, unspecified: Secondary | ICD-10-CM | POA: Diagnosis not present

## 2023-10-14 DIAGNOSIS — E538 Deficiency of other specified B group vitamins: Secondary | ICD-10-CM | POA: Diagnosis not present

## 2023-10-14 DIAGNOSIS — D649 Anemia, unspecified: Secondary | ICD-10-CM | POA: Insufficient documentation

## 2023-10-14 DIAGNOSIS — L4050 Arthropathic psoriasis, unspecified: Secondary | ICD-10-CM

## 2023-10-14 DIAGNOSIS — K5180 Other ulcerative colitis without complications: Secondary | ICD-10-CM

## 2023-10-14 DIAGNOSIS — K5190 Ulcerative colitis, unspecified, without complications: Secondary | ICD-10-CM | POA: Diagnosis not present

## 2023-10-14 MED ORDER — NA SULFATE-K SULFATE-MG SULF 17.5-3.13-1.6 GM/177ML PO SOLN: 1.0000 | Freq: Once | ORAL | 0 refills | Status: AC

## 2023-10-14 NOTE — Patient Instructions (Addendum)
 You have been scheduled for a colonoscopy. Please follow written instructions given to you at your visit today.   If you use inhalers (even only as needed), please bring them with you on the day of your procedure.  DO NOT TAKE 7 DAYS PRIOR TO TEST- Trulicity (dulaglutide) Ozempic, Wegovy (semaglutide) Mounjaro (tirzepatide) Bydureon Bcise (exanatide extended release)  DO NOT TAKE 1 DAY PRIOR TO YOUR TEST Rybelsus (semaglutide) Adlyxin (lixisenatide) Victoza (liraglutide) Byetta (exanatide) _______________________________________________________________________  _______________________________________________________  If your blood pressure at your visit was 140/90 or greater, please contact your primary care physician to follow up on this.  _______________________________________________________  If you are age 32 or older, your body mass index should be between 23-30. Your Body mass index is 32.54 kg/m. If this is out of the aforementioned range listed, please consider follow up with your Primary Care Provider.  If you are age 21 or younger, your body mass index should be between 19-25. Your Body mass index is 32.54 kg/m. If this is out of the aformentioned range listed, please consider follow up with your Primary Care Provider.   ________________________________________________________  The [facility] providers would like to encourage you to use MYCHART to communicate with providers for non-urgent requests or questions.  Due to long hold times on the telephone, sending your provider a message by [facility] may be a faster and more efficient way to get a response.  Please allow 48 business hours for a response.  Please remember that this is for non-urgent requests.  _______________________________________________________  [facility] is using a team-based approach to care.  Your team is made up of your doctor and two to three APPS. Our APPS (Nurse Practitioners and  Physician Assistants) work with your physician to ensure care continuity for you. They are fully qualified to address your health concerns and develop a treatment plan. They communicate directly with your gastroenterologist to care for you. Seeing the Advanced Practice Practitioners on your physician's team can help you by facilitating care more promptly, often allowing for earlier appointments, access to diagnostic testing, procedures, and other specialty referrals.

## 2023-10-14 NOTE — Progress Notes (Signed)
 10/14/2023 Meagan Grimes [id number] 1975/11/07   HISTORY OF PRESENT ILLNESS: This is a 48 year old female who is a patient of Dr. Trenna.  Has UC.  Last seen in the office 02/17/2022.  She I son Rinvoq  since 12/2021 and has maintained on 30 mg daily.  Is doing very well.  Having 1 bowel movement a day to every other day that is formed.  No rectal bleeding.  No abdominal pain.  No eye symptoms.  Arthritis symptoms doing well for the most part also.  Does note some very mild side effects of her Rinvoq , nothing that she cannot manage.  Reports acne.  Is asking about possibly decreasing the dose to 15 mg.  IBD history (prior medications titers why stopped and current meds with last dose): Diagnosed 10/2016 after colonoscopy for diarrhea and rectal bleeding by Dr. Shila which showed moderate information with ulcers in rectum, sigmoid and descending colon.  Right colon and terminal ileum normal. Patient was already on Humira  for psoriatic arthritis every other week. Started on Lialda  4.8 daily and Uceris  9 mg daily at that time. 02/17/2018 worsening rectal bleeding and diarrhea with posterior fissure, treated nitroglycerin 09/20/2019 diarrhea, started on prednisone  taper CT abdomen pelvis showed thickening of sigmoid colon with UC flare. 11/22/2020 adalimumab  drug level and antibodies showed low trough at 2.4, no antibodies. 12/20/2020 colonoscopy for GI bleeding and UC showed continuous area of bleeding ulcerated mucosa rectum into the sigmoid and descending colon.  Pathology showed moderately active chronic colitis, negative dysplasia Increased Humira  40 mg subcutaneously every week and continued on budesonide  9 mg daily for 2 months. 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  Other significant medical history: Patient is on oral B12 Patient has psoriatic arthritis follows with Dr.Beekman Patient has vitamin D  deficiency Patient has iron deficiency anemia   Past Medical History:  Diagnosis Date   Anxiety    Arthritis    Arthropathic psoriasis (HCC)    Crohn's disease (HCC)    Hypertension    Rheumatoid arthritis (HCC)    Past Surgical History:  Procedure Laterality Date   CESAREAN SECTION     COLONOSCOPY     LASER ABLATION      reports that she has quit smoking. She has never used smokeless tobacco. She reports current alcohol use of about 5.0 standard drinks of alcohol per week. She reports that she does not use drugs. family history includes Heart disease in her maternal grandfather and paternal grandmother; Hypertension in her father, maternal grandfather, mother, and paternal grandmother; Lung cancer in her maternal aunt and maternal grandmother; Mental illness in her father. Allergies  Allergen Reactions   No Known Allergies       Outpatient Encounter Medications as of 10/14/2023  Medication Sig   ALPRAZolam (XANAX) 0.5 MG tablet Take 0.5 mg by mouth daily.   amLODipine  (NORVASC ) 5 MG tablet  TAKE 1 TABLET (5 MG TOTAL) BY MOUTH DAILY.   hydrochlorothiazide  (HYDRODIURIL ) 25 MG tablet TAKE 1 TABLET BY MOUTH DAILY   hydrocortisone  (ANUSOL -HC) 2.5 % rectal cream Place 1 Application rectally 2 (two)  times daily.   Omega-3 Fatty Acids (FISH OIL ADULT GUMMIES PO) Take by mouth.   RINVOQ  30 MG TB24 TAKE 1 TABLET BY MOUTH 1 TIME A DAY   VITAMIN D , ERGOCALCIFEROL , PO Take 1,000 Units by mouth.   No facility-administered encounter medications on file as of 10/14/2023.    REVIEW OF SYSTEMS  : All other systems reviewed and negative except where noted in the History of Present Illness.   PHYSICAL EXAM: BP 128/82   Pulse 76   Ht 5' 6 (1.676 m)   Wt 201 lb 9.6 oz (91.4 kg)   BMI 32.54 kg/m  General: Well developed white female in no acute distress Head: Normocephalic and atraumatic Eyes:  Sclerae anicteric, conjunctiva pink. Ears: Normal auditory acuity Lungs: Clear throughout to auscultation; no W/R/R. Heart: Regular rate and rhythm; no M/R/G. Abdomen: Soft, non-distended.  BS present.  Non-tender. Rectal:  Will be done at the time of colonoscopy.   Musculoskeletal: Symmetrical with no gross deformities  Skin: No lesions on visible extremities Extremities: No edema  Neurological: Alert oriented x 4, grossly non-focal Psychological:  Alert and cooperative. 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 UC diagnosed 2018 after colonoscopy, no history of complications Colonoscopy 12/2021 Mayo score 2 Was having 10-15 bowel movements daily, blood in the stool, elevated CRP, sed rate, fecal calprotectin, negative GI pathogen panel Rinvoq  started 10/24 with significant improvement in her clinical symptoms.  - Was due for recall colonoscopy October 2024.  Will schedule with Dr. Nandigam.  The risks, benefits, and alternatives to colonoscopy were discussed with the patient and she consents to proceed.  - She has maintained on Rinvoq  30 mg daily.  Is questioning if she could potentially go down to 15 mg daily.  Will assess endoscopic and  histologic appearance of the colon and if all looks well then probably can consider decreasing the dose since she is clinically doing very well also -Will update labs including CBC, BMP, hepatic function panel, iron studies, vitamin B12 levels, quant gold.   Vitamin B12 deficiency Continue treatment   Vitamin D  deficiency Continue treatment    Psoriatic arthritis (HCC) Previously on Humira , now on rinvoq  Follows with Dr. Mai   CC:  Theophilus Andrews, Estel*

## 2023-10-21 ENCOUNTER — Other Ambulatory Visit: Payer: Self-pay | Admitting: *Deleted

## 2023-10-21 ENCOUNTER — Telehealth: Payer: Self-pay | Admitting: *Deleted

## 2023-10-21 DIAGNOSIS — K5180 Other ulcerative colitis without complications: Secondary | ICD-10-CM

## 2023-10-21 NOTE — Telephone Encounter (Signed)
 Patient informed.

## 2023-10-21 NOTE — Telephone Encounter (Signed)
-----   Message from Mayfield D. Zehr sent at 10/14/2023  1:28 PM EDT ----- She needs updated labs.  Please order a CBC, BMP, hepatic function panel, iron studies, B12 levels, vitamin D  levels, QuantiFERON gold.  These can be done the day that she comes in for her colonoscopy.  Thank you,  Jess

## 2023-11-04 ENCOUNTER — Other Ambulatory Visit: Payer: Self-pay | Admitting: Internal Medicine

## 2023-11-04 DIAGNOSIS — I10 Essential (primary) hypertension: Secondary | ICD-10-CM

## 2023-11-09 ENCOUNTER — Telehealth: Payer: Self-pay | Admitting: Gastroenterology

## 2023-11-09 NOTE — Telephone Encounter (Signed)
 Good morning Dr. Nandigam,   I received a call from this patient stating that she had a work urgency and need to reschedule her colonoscopy set for September the 16 th. Patient was reschedule for October the 30 th. Please advise.   Thank you.

## 2023-11-17 ENCOUNTER — Encounter: Admitting: Gastroenterology

## 2023-12-01 ENCOUNTER — Telehealth: Payer: Self-pay

## 2023-12-01 NOTE — Telephone Encounter (Signed)
 Pharmacy Patient Advocate Encounter   Received notification from CoverMyMeds that prior authorization for Rinvoq  30MG  er tablets is required/requested.   Insurance verification completed.   The patient is insured through [facility] .   Per test claim: PA required; PA submitted to above mentioned insurance via Latent Key/confirmation #/EOC [id number] Status is pending

## 2023-12-02 ENCOUNTER — Other Ambulatory Visit: Payer: Self-pay | Admitting: *Deleted

## 2023-12-02 ENCOUNTER — Other Ambulatory Visit (HOSPITAL_COMMUNITY): Payer: Self-pay

## 2023-12-02 ENCOUNTER — Encounter: Admitting: Internal Medicine

## 2023-12-02 DIAGNOSIS — I10 Essential (primary) hypertension: Secondary | ICD-10-CM

## 2023-12-02 MED ORDER — HYDROCHLOROTHIAZIDE 25 MG PO TABS: 25.0000 mg | ORAL_TABLET | Freq: Every day | ORAL | 0 refills | Status: DC

## 2023-12-02 MED ORDER — AMLODIPINE BESYLATE 5 MG PO TABS: 5.0000 mg | ORAL_TABLET | Freq: Every day | ORAL | 0 refills | Status: DC

## 2023-12-02 NOTE — Telephone Encounter (Signed)
 Pharmacy Patient Advocate Encounter  Received notification from [facility] that Prior Authorization for  Rinvoq  30MG  er tablets has been APPROVED from 12-01-2023 to 11-30-2024  Must be filled at outside specialty pharmacy.  PA #/Case ID/Reference #: [id number]

## 2023-12-02 NOTE — Telephone Encounter (Signed)
 Notified via mychart.

## 2023-12-03 NOTE — Progress Notes (Signed)
 This encounter was created in error - please disregard.

## 2023-12-07 ENCOUNTER — Other Ambulatory Visit: Payer: Self-pay | Admitting: Internal Medicine

## 2023-12-07 DIAGNOSIS — I10 Essential (primary) hypertension: Secondary | ICD-10-CM

## 2023-12-21 ENCOUNTER — Encounter: Payer: Self-pay | Admitting: Gastroenterology

## 2023-12-23 ENCOUNTER — Other Ambulatory Visit: Payer: Self-pay

## 2023-12-23 MED ORDER — ONDANSETRON 4 MG PO TBDP: 4.0000 mg | ORAL_TABLET | Freq: Three times a day (TID) | ORAL | 0 refills | Status: AC | PRN

## 2023-12-24 ENCOUNTER — Encounter: Payer: Self-pay | Admitting: Gastroenterology

## 2023-12-29 ENCOUNTER — Other Ambulatory Visit: Payer: Self-pay | Admitting: Medical Genetics

## 2023-12-31 ENCOUNTER — Encounter: Payer: Self-pay | Admitting: Gastroenterology

## 2023-12-31 ENCOUNTER — Ambulatory Visit: Admitting: Gastroenterology

## 2023-12-31 VITALS — BP 127/75 | HR 65 | Temp 97.6°F | Resp 12 | Ht 66.0 in | Wt 201.0 lb

## 2023-12-31 DIAGNOSIS — K5100 Ulcerative (chronic) pancolitis without complications: Secondary | ICD-10-CM

## 2023-12-31 DIAGNOSIS — K529 Noninfective gastroenteritis and colitis, unspecified: Secondary | ICD-10-CM | POA: Diagnosis not present

## 2023-12-31 DIAGNOSIS — K644 Residual hemorrhoidal skin tags: Secondary | ICD-10-CM | POA: Diagnosis not present

## 2023-12-31 DIAGNOSIS — K5150 Left sided colitis without complications: Secondary | ICD-10-CM | POA: Diagnosis not present

## 2023-12-31 DIAGNOSIS — K648 Other hemorrhoids: Secondary | ICD-10-CM

## 2023-12-31 DIAGNOSIS — Z1211 Encounter for screening for malignant neoplasm of colon: Secondary | ICD-10-CM

## 2023-12-31 MED ORDER — SODIUM CHLORIDE 0.9 % IV SOLN: 500.0000 mL | Freq: Once | INTRAVENOUS | Status: DC

## 2023-12-31 NOTE — Patient Instructions (Addendum)
 Educational handout provided to patient related to Hemorrhoids AND ULCERATIVE COLITIS  Resume previous diet  Continue present medications  Awaiting pathology results  RECALL PUT IN FOR FOLLOW UP APPT. IN 3 MONTHS   YOU HAD AN ENDOSCOPIC PROCEDURE TODAY AT THE [facility]:   Refer to the procedure report that was given to you for any specific questions about what was found during the examination.  If the procedure report does not answer your questions, please call your gastroenterologist to clarify.  If you requested that your care partner not be given the details of your procedure findings, then the procedure report has been included in a sealed envelope for you to review at your convenience later.  YOU SHOULD EXPECT: Some feelings of bloating in the abdomen. Passage of more gas than usual.  Walking can help get rid of the air that was put into your GI tract during the procedure and reduce the bloating. If you had a lower endoscopy (such as a colonoscopy or flexible sigmoidoscopy) you may notice spotting of blood in your stool or on the toilet paper. If you underwent a bowel prep for your procedure, you may not have a normal bowel movement for a few days.  Please Note:  You might notice some irritation and congestion in your nose or some drainage.  This is from the oxygen used during your procedure.  There is no need for concern and it should clear up in a day or so.  SYMPTOMS TO REPORT IMMEDIATELY:  Following lower endoscopy (colonoscopy or flexible sigmoidoscopy):  Excessive amounts of blood in the stool  Significant tenderness or worsening of abdominal pains  Swelling of the abdomen that is new, acute  Fever of 100F or higher  For urgent or emergent issues, a gastroenterologist can be reached at any hour by calling [phone]. Do not use MyChart messaging for urgent concerns.    DIET:  We do recommend a small meal at first, but then you may proceed to your regular  diet.  Drink plenty of fluids but you should avoid alcoholic beverages for 24 hours.  ACTIVITY:  You should plan to take it easy for the rest of today and you should NOT DRIVE or use heavy machinery until tomorrow (because of the sedation medicines used during the test).    FOLLOW UP: Our staff will call the number listed on your records the next business day following your procedure.  We will call around 7:15- 8:00 am to check on you and address any questions or concerns that you may have regarding the information given to you following your procedure. If we do not reach you, we will leave a message.     If any biopsies were taken you will be contacted by phone or by letter within the next 1-3 weeks.  Please call us  at [phone] if you have not heard about the biopsies in 3 weeks.    SIGNATURES/CONFIDENTIALITY: You and/or your care partner have signed paperwork which will be entered into your electronic medical record.  These signatures attest to the fact that that the information above on your After Visit Summary has been reviewed and is understood.  Full responsibility of the confidentiality of this discharge information lies with you and/or your care-partner.

## 2023-12-31 NOTE — Op Note (Signed)
 [facility] Patient Name: Meagan Grimes Procedure Date: 12/31/2023 11:00 AM MRN: [id number] Endoscopist: Gustav ALONSO Mcgee , MD, [id number] Age: 48 Referring MD:  Date of Birth: April 29, 1975 Gender: Female Account #: [id number] Procedure:                Colonoscopy Indications:              Follow-up of chronic ulcerative pancolitis, Disease                            activity assessment of chronic ulcerative                            pancolitis, Assess therapeutic response to therapy                            of chronic ulcerative pancolitis Medicines:                Monitored Anesthesia Care Procedure:                Pre-Anesthesia Assessment:                           - Prior to the procedure, a History and Physical                            was performed, and patient medications and                            allergies were reviewed. The patient's tolerance of                            previous anesthesia was also reviewed. The risks                            and benefits of the procedure and the sedation                            options and risks were discussed with the patient.                            All questions were answered, and informed consent                            was obtained. Prior Anticoagulants: The patient has                            taken no anticoagulant or antiplatelet agents. ASA                            Grade Assessment: II - A patient with mild systemic                            disease. After reviewing the risks and benefits,  the patient was deemed in satisfactory condition to                            undergo the procedure.                           After obtaining informed consent, the colonoscope                            was passed under direct vision. Throughout the                            procedure, the patient's blood pressure, pulse, and                            oxygen saturations were  monitored continuously. The                            [id number]-HQ190L Colonoscope [id number] was introduced                            through the anus and advanced to the the terminal                            ileum, with identification of the appendiceal                            orifice and IC valve. The colonoscopy was performed                            without difficulty. The patient tolerated the                            procedure well. The quality of the bowel                            preparation was good. The terminal ileum, ileocecal                            valve, appendiceal orifice, and rectum were                            photographed. Scope In: 11:12:56 AM Scope Out: 11:27:58 AM Scope Withdrawal Time: 0 hours 10 minutes 51 seconds  Total Procedure Duration: 0 hours 15 minutes 2 seconds  Findings:                 The perianal and digital rectal examinations were                            normal.                           Inflammation was found in a continuous and  circumferential pattern from the anus to the cecum.                            Noted inflammatory polypoid pseudopolyps in the                            sigmoid colon. This was graded as Mayo Score 1                            (mild, with erythema, decreased vascular pattern,                            mild friability), and when compared to the previous                            examination, the findings are improved. Biopsies                            were taken with a cold forceps for histology.                           Non-bleeding external and internal hemorrhoids were                            found during retroflexion. The hemorrhoids were                            medium-sized. Complications:            No immediate complications. Estimated Blood Loss:     Estimated blood loss was minimal. Impression:               - Mild (Mayo Score 1) pancolitis ulcerative                             colitis, improved since the last examination.                            Biopsied.                           - Non-bleeding external and internal hemorrhoids. Recommendation:           - Patient has a contact number available for                            emergencies. The signs and symptoms of potential                            delayed complications were discussed with the                            patient. Return to normal activities tomorrow.                            Written discharge instructions were provided  to the                            patient.                           - Resume previous diet.                           - Continue present medications.                           - Await pathology results.                           - Repeat colonoscopy in 3 years for surveillance                            based on pathology results.                           - Return to GI clinic in 3 months. Doyce Saling V. Alphonso Gregson, MD 12/31/2023 11:34:40 AM This report has been signed electronically.

## 2023-12-31 NOTE — Progress Notes (Signed)
 RECALL PUT IN FOR FOLLOW UP 3 MONTH APPT.

## 2023-12-31 NOTE — Progress Notes (Signed)
 [facility] History and Physical   Primary Care Physician:  Theophilus Andrews, Tully GRADE, MD   Reason for Procedure:  History of IBD, Ulcerative colitis  Plan:    Surveillance colonoscopy with possible interventions as needed     HPI: Meagan Grimes is a very pleasant 48 y.o. female here for surveillance colonoscopy, assess disease activity with h/o IBD, ulcerative colitis .  Please refer to office visit not by Jessica Zehr for details  The risks and benefits as well as alternatives of endoscopic procedure(s) have been discussed and reviewed.  The patient was provided an opportunity to ask questions and all were answered. The patient agreed with the plan and demonstrated an understanding of the instructions.   Past Medical History:  Diagnosis Date   Anxiety    Arthritis    Arthropathic psoriasis (HCC)    Crohn's disease (HCC)    Hypertension    Rheumatoid arthritis (HCC)     Past Surgical History:  Procedure Laterality Date   CESAREAN SECTION     COLONOSCOPY     LASER ABLATION      Prior to Admission medications   Medication Sig Start Date End Date Taking? Authorizing Provider  amLODipine  (NORVASC ) 5 MG tablet Take 1 tablet (5 mg total) by mouth daily. 12/02/23  Yes Theophilus Andrews, Tully GRADE, MD  hydrochlorothiazide  (HYDRODIURIL ) 25 MG tablet TAKE 1 TABLET BY MOUTH DAILY 12/07/23  Yes Theophilus Andrews, Tully GRADE, MD  Omega-3 Fatty Acids (FISH OIL ADULT GUMMIES PO) Take by mouth.   Yes [provider]  ondansetron (ZOFRAN-ODT) 4 MG disintegrating tablet Take 1 tablet (4 mg total) by mouth every 8 (eight) hours as needed for nausea or vomiting. 12/23/23  Yes Naquisha Whitehair V, MD  RINVOQ  30 MG TB24 TAKE 1 TABLET BY MOUTH 1 TIME A DAY 06/09/23  Yes Craig Palma R, PA-C  VITAMIN D , ERGOCALCIFEROL , PO Take 1,000 Units by mouth.   Yes [provider]  ALPRAZolam (XANAX) 0.5 MG tablet Take 0.5 mg by mouth daily. 09/11/21   [provider]   hydrocortisone  (ANUSOL -HC) 2.5 % rectal cream Place 1 Application rectally 2 (two) times daily. 02/17/22   Craig Palma SAUNDERS, PA-C    Current Outpatient Medications  Medication Sig Dispense Refill   amLODipine  (NORVASC ) 5 MG tablet Take 1 tablet (5 mg total) by mouth daily. 90 tablet 0   hydrochlorothiazide  (HYDRODIURIL ) 25 MG tablet TAKE 1 TABLET BY MOUTH DAILY 90 tablet 0   Omega-3 Fatty Acids (FISH OIL ADULT GUMMIES PO) Take by mouth.     ondansetron (ZOFRAN-ODT) 4 MG disintegrating tablet Take 1 tablet (4 mg total) by mouth every 8 (eight) hours as needed for nausea or vomiting. 5 tablet 0   RINVOQ  30 MG TB24 TAKE 1 TABLET BY MOUTH 1 TIME A DAY 30 tablet 6   VITAMIN D , ERGOCALCIFEROL , PO Take 1,000 Units by mouth.     ALPRAZolam (XANAX) 0.5 MG tablet Take 0.5 mg by mouth daily.     hydrocortisone  (ANUSOL -HC) 2.5 % rectal cream Place 1 Application rectally 2 (two) times daily. 30 g 2   Current Facility-Administered Medications  Medication Dose Route Frequency Provider Last Rate Last Admin   0.9 %  sodium chloride  infusion  500 mL Intravenous Once Osha Rane V, MD        Allergies as of 12/31/2023   (No Known Allergies)    Family History  Problem Relation Age of Onset   Hypertension Mother    Hypertension Father  Mental illness Father    Lung cancer Maternal Aunt    Lung cancer Maternal Grandmother    Hypertension Maternal Grandfather    Heart disease Maternal Grandfather    Heart disease Paternal Grandmother    Hypertension Paternal Grandmother    Colon cancer Neg Hx    Esophageal cancer Neg Hx    Rectal cancer Neg Hx    Liver cancer Neg Hx    Stomach cancer Neg Hx    Colon polyps Neg Hx     Social History   Socioeconomic History   Marital status: Married    Spouse name: Not on file   Number of children: 3   Years of education: Not on file   Highest education level: Master's degree (e.g., MA, MS, MEng, MEd, MSW, MBA)  Occupational History   Occupation:  Estate manager/land agent  Tobacco Use   Smoking status: Former   Smokeless tobacco: Never  Advertising Account Planner   Vaping status: Never Used  Substance and Sexual Activity   Alcohol use: Yes    Alcohol/week: 5.0 standard drinks of alcohol    Types: 5 Glasses of wine per week   Drug use: No   Sexual activity: Yes    Partners: Male    Birth control/protection: None  Other Topics Concern   Not on file  Social History Narrative   Not on file   Social Drivers of Health   Financial Resource Strain: Low Risk  (10/23/2021)   Overall Financial Resource Strain (CARDIA)    Difficulty of Paying Living Expenses: Not hard at all  Food Insecurity: No Food Insecurity (10/23/2021)   Hunger Vital Sign    Worried About Running Out of Food in the Last Year: Never true    Ran Out of Food in the Last Year: Never true  Transportation Needs: No Transportation Needs (10/23/2021)   PRAPARE - Administrator, Civil Service (Medical): No    Lack of Transportation (Non-Medical): No  Physical Activity: Insufficiently Active (10/23/2021)   Exercise Vital Sign    Days of Exercise per Week: 2 days    Minutes of Exercise per Session: 30 min  Stress: Stress Concern Present (10/23/2021)   [employer] of Occupational Health - Occupational Stress Questionnaire    Feeling of Stress : Very much  Social Connections: Moderately Integrated (10/23/2021)   Social Connection and Isolation Panel    Frequency of Communication with Friends and Family: Twice a week    Frequency of Social Gatherings with Friends and Family: Three times a week    Attends Religious Services: 1 to 4 times per year    Active Member of Clubs or Organizations: No    Attends Engineer, Structural: Not on file    Marital Status: Married  Catering Manager Violence: Not on file    Review of Systems:  All other review of systems negative except as mentioned in the HPI.  Physical Exam: Vital signs in last 24 hours: BP (!) 142/79   Pulse 72    Temp 97.6 F (36.4 C)   Ht 5' 6 (1.676 m)   Wt 201 lb (91.2 kg)   SpO2 99%   BMI 32.44 kg/m  General:   Alert, NAD Lungs:  Clear .   Heart:  Regular rate and rhythm Abdomen:  Soft, nontender and nondistended. Neuro/Psych:  Alert and cooperative. Normal mood and affect. A and O x 3  Reviewed labs, radiology imaging, old records and pertinent past GI work up  Patient  is appropriate for planned procedure(s) and anesthesia in an ambulatory setting   K. Veena Arnold Depinto , MD [phone]

## 2023-12-31 NOTE — Progress Notes (Signed)
 Vss nad trans to pacu

## 2024-01-01 ENCOUNTER — Telehealth: Payer: Self-pay | Admitting: *Deleted

## 2024-01-01 NOTE — Telephone Encounter (Signed)
  Follow up Call-     12/31/2023   10:23 AM 12/17/2021    9:44 AM 12/17/2021    9:37 AM  Call back number  Post procedure Call Back phone  # [phone]   Permission to leave phone message Yes  Yes     Patient questions:  Do you have a fever, pain , or abdominal swelling? No. Pain Score  0 *  Have you tolerated food without any problems? Yes.    Have you been able to return to your normal activities? Yes.    Do you have any questions about your discharge instructions: Diet   No. Medications  No. Follow up visit  No.  Do you have questions or concerns about your Care? No.  Actions: * If pain score is 4 or above: No action needed, pain <4.

## 2024-01-04 LAB — SURGICAL PATHOLOGY

## 2024-01-11 ENCOUNTER — Other Ambulatory Visit: Payer: Self-pay | Admitting: Physician Assistant

## 2024-01-13 ENCOUNTER — Ambulatory Visit: Payer: Self-pay | Admitting: Gastroenterology

## 2024-01-21 ENCOUNTER — Other Ambulatory Visit

## 2024-02-10 ENCOUNTER — Encounter: Admitting: Internal Medicine

## 2024-02-10 DIAGNOSIS — I10 Essential (primary) hypertension: Secondary | ICD-10-CM

## 2024-02-27 ENCOUNTER — Other Ambulatory Visit: Payer: Self-pay | Admitting: Internal Medicine

## 2024-02-27 DIAGNOSIS — I10 Essential (primary) hypertension: Secondary | ICD-10-CM

## 2024-02-29 ENCOUNTER — Other Ambulatory Visit

## 2024-03-23 ENCOUNTER — Encounter: Payer: Self-pay | Admitting: Internal Medicine

## 2024-03-23 DIAGNOSIS — I10 Essential (primary) hypertension: Secondary | ICD-10-CM

## 2024-03-23 MED ORDER — AMLODIPINE BESYLATE 5 MG PO TABS: 5.0000 mg | ORAL_TABLET | Freq: Every day | ORAL | 0 refills | Status: AC

## 2024-03-23 MED ORDER — HYDROCHLOROTHIAZIDE 25 MG PO TABS: 25.0000 mg | ORAL_TABLET | Freq: Every day | ORAL | 0 refills | Status: AC

## 2024-05-26 ENCOUNTER — Encounter: Admitting: Internal Medicine
